# Patient Record
Sex: Female | Born: 1965 | Race: White | Hispanic: No | Smoking: Former smoker
Health system: Southern US, Community
[De-identification: ages and names within clinical notes are randomized; demographics above are authoritative.]

## PROBLEM LIST (undated history)

## (undated) DIAGNOSIS — F419 Anxiety disorder, unspecified: Secondary | ICD-10-CM

## (undated) DIAGNOSIS — I341 Nonrheumatic mitral (valve) prolapse: Secondary | ICD-10-CM

## (undated) DIAGNOSIS — F32A Depression, unspecified: Secondary | ICD-10-CM

## (undated) DIAGNOSIS — E785 Hyperlipidemia, unspecified: Secondary | ICD-10-CM

## (undated) DIAGNOSIS — J9383 Other pneumothorax: Secondary | ICD-10-CM

## (undated) DIAGNOSIS — J8489 Other specified interstitial pulmonary diseases: Secondary | ICD-10-CM

## (undated) DIAGNOSIS — J8410 Pulmonary fibrosis, unspecified: Secondary | ICD-10-CM

## (undated) DIAGNOSIS — K589 Irritable bowel syndrome without diarrhea: Secondary | ICD-10-CM

## (undated) DIAGNOSIS — G43909 Migraine, unspecified, not intractable, without status migrainosus: Secondary | ICD-10-CM

## (undated) DIAGNOSIS — D126 Benign neoplasm of colon, unspecified: Secondary | ICD-10-CM

## (undated) DIAGNOSIS — K635 Polyp of colon: Secondary | ICD-10-CM

## (undated) DIAGNOSIS — K7460 Unspecified cirrhosis of liver: Secondary | ICD-10-CM

## (undated) DIAGNOSIS — K219 Gastro-esophageal reflux disease without esophagitis: Secondary | ICD-10-CM

## (undated) DIAGNOSIS — F329 Major depressive disorder, single episode, unspecified: Secondary | ICD-10-CM

## (undated) DIAGNOSIS — D509 Iron deficiency anemia, unspecified: Secondary | ICD-10-CM

## (undated) HISTORY — DX: Migraine, unspecified, not intractable, without status migrainosus: G43.909

## (undated) HISTORY — DX: Unspecified cirrhosis of liver: K74.60

## (undated) HISTORY — DX: Polyp of colon: K63.5

## (undated) HISTORY — DX: Other pneumothorax: J93.83

## (undated) HISTORY — DX: Irritable bowel syndrome, unspecified: K58.9

## (undated) HISTORY — DX: Benign neoplasm of colon, unspecified: D12.6

## (undated) HISTORY — DX: Major depressive disorder, single episode, unspecified: F32.9

## (undated) HISTORY — DX: Iron deficiency anemia, unspecified: D50.9

## (undated) HISTORY — DX: Gastro-esophageal reflux disease without esophagitis: K21.9

## (undated) HISTORY — DX: Anxiety disorder, unspecified: F41.9

## (undated) HISTORY — DX: Nonrheumatic mitral (valve) prolapse: I34.1

## (undated) HISTORY — PX: NASAL SEPTUM SURGERY: SHX37

## (undated) HISTORY — DX: Depression, unspecified: F32.A

## (undated) HISTORY — DX: Hyperlipidemia, unspecified: E78.5

## (undated) HISTORY — PX: FINGER FRACTURE SURGERY: SHX638

---

## 1999-05-02 ENCOUNTER — Other Ambulatory Visit: Admission: RE | Admit: 1999-05-02 | Discharge: 1999-05-02 | Payer: Self-pay | Admitting: Obstetrics & Gynecology

## 1999-07-19 ENCOUNTER — Encounter: Payer: Self-pay | Admitting: Internal Medicine

## 1999-07-19 ENCOUNTER — Ambulatory Visit (HOSPITAL_COMMUNITY): Admission: RE | Admit: 1999-07-19 | Discharge: 1999-07-19 | Payer: Self-pay | Admitting: Internal Medicine

## 1999-07-29 ENCOUNTER — Ambulatory Visit (HOSPITAL_COMMUNITY): Admission: RE | Admit: 1999-07-29 | Discharge: 1999-07-29 | Payer: Self-pay | Admitting: Internal Medicine

## 2000-05-28 ENCOUNTER — Other Ambulatory Visit: Admission: RE | Admit: 2000-05-28 | Discharge: 2000-05-28 | Payer: Self-pay | Admitting: Obstetrics & Gynecology

## 2001-06-10 ENCOUNTER — Other Ambulatory Visit: Admission: RE | Admit: 2001-06-10 | Discharge: 2001-06-10 | Payer: Self-pay | Admitting: Obstetrics & Gynecology

## 2001-11-06 HISTORY — PX: OTHER SURGICAL HISTORY: SHX169

## 2002-07-14 ENCOUNTER — Other Ambulatory Visit: Admission: RE | Admit: 2002-07-14 | Discharge: 2002-07-14 | Payer: Self-pay | Admitting: Obstetrics & Gynecology

## 2003-02-27 ENCOUNTER — Encounter: Admission: RE | Admit: 2003-02-27 | Discharge: 2003-02-27 | Payer: Self-pay | Admitting: Family Medicine

## 2003-02-27 ENCOUNTER — Encounter: Payer: Self-pay | Admitting: Family Medicine

## 2003-11-07 HISTORY — PX: TONSILLECTOMY: SUR1361

## 2003-12-06 ENCOUNTER — Emergency Department (HOSPITAL_COMMUNITY): Admission: EM | Admit: 2003-12-06 | Discharge: 2003-12-07 | Payer: Self-pay | Admitting: Emergency Medicine

## 2004-01-04 ENCOUNTER — Other Ambulatory Visit: Admission: RE | Admit: 2004-01-04 | Discharge: 2004-01-04 | Payer: Self-pay | Admitting: Obstetrics & Gynecology

## 2004-04-15 ENCOUNTER — Encounter (INDEPENDENT_AMBULATORY_CARE_PROVIDER_SITE_OTHER): Payer: Self-pay | Admitting: *Deleted

## 2004-04-15 ENCOUNTER — Ambulatory Visit (HOSPITAL_BASED_OUTPATIENT_CLINIC_OR_DEPARTMENT_OTHER): Admission: RE | Admit: 2004-04-15 | Discharge: 2004-04-15 | Payer: Self-pay | Admitting: Otolaryngology

## 2004-04-15 ENCOUNTER — Ambulatory Visit (HOSPITAL_COMMUNITY): Admission: RE | Admit: 2004-04-15 | Discharge: 2004-04-15 | Payer: Self-pay | Admitting: Otolaryngology

## 2005-01-13 ENCOUNTER — Other Ambulatory Visit: Admission: RE | Admit: 2005-01-13 | Discharge: 2005-01-13 | Payer: Self-pay | Admitting: Obstetrics & Gynecology

## 2005-09-18 ENCOUNTER — Ambulatory Visit: Payer: Self-pay | Admitting: Internal Medicine

## 2005-10-16 ENCOUNTER — Ambulatory Visit: Payer: Self-pay | Admitting: Internal Medicine

## 2005-12-22 ENCOUNTER — Ambulatory Visit: Payer: Self-pay | Admitting: Internal Medicine

## 2006-04-04 ENCOUNTER — Encounter (INDEPENDENT_AMBULATORY_CARE_PROVIDER_SITE_OTHER): Payer: Self-pay | Admitting: *Deleted

## 2006-04-04 ENCOUNTER — Ambulatory Visit: Payer: Self-pay | Admitting: Gynecology

## 2006-04-04 ENCOUNTER — Ambulatory Visit (HOSPITAL_COMMUNITY): Admission: RE | Admit: 2006-04-04 | Discharge: 2006-04-04 | Payer: Self-pay | Admitting: Family Medicine

## 2006-04-30 ENCOUNTER — Ambulatory Visit: Payer: Self-pay | Admitting: Internal Medicine

## 2006-06-12 ENCOUNTER — Ambulatory Visit: Payer: Self-pay | Admitting: Internal Medicine

## 2006-06-29 ENCOUNTER — Ambulatory Visit: Payer: Self-pay | Admitting: Internal Medicine

## 2006-11-06 HISTORY — PX: NISSEN FUNDOPLICATION: SHX2091

## 2007-10-11 ENCOUNTER — Ambulatory Visit: Payer: Self-pay | Admitting: Internal Medicine

## 2007-12-09 ENCOUNTER — Encounter: Payer: Self-pay | Admitting: Internal Medicine

## 2007-12-09 ENCOUNTER — Ambulatory Visit (HOSPITAL_COMMUNITY): Admission: RE | Admit: 2007-12-09 | Discharge: 2007-12-09 | Payer: Self-pay | Admitting: Internal Medicine

## 2007-12-16 ENCOUNTER — Ambulatory Visit: Payer: Self-pay | Admitting: Internal Medicine

## 2008-01-09 ENCOUNTER — Encounter: Admission: RE | Admit: 2008-01-09 | Discharge: 2008-01-09 | Payer: Self-pay | Admitting: Surgery

## 2008-02-01 ENCOUNTER — Inpatient Hospital Stay (HOSPITAL_COMMUNITY): Admission: AD | Admit: 2008-02-01 | Discharge: 2008-02-02 | Payer: Self-pay | Admitting: Surgery

## 2008-02-18 ENCOUNTER — Ambulatory Visit (HOSPITAL_COMMUNITY): Admission: RE | Admit: 2008-02-18 | Discharge: 2008-02-18 | Payer: Self-pay | Admitting: Surgery

## 2008-02-20 ENCOUNTER — Ambulatory Visit (HOSPITAL_COMMUNITY): Admission: RE | Admit: 2008-02-20 | Discharge: 2008-02-20 | Payer: Self-pay | Admitting: General Surgery

## 2008-03-03 ENCOUNTER — Encounter: Payer: Self-pay | Admitting: Internal Medicine

## 2008-08-04 ENCOUNTER — Encounter: Payer: Self-pay | Admitting: Internal Medicine

## 2009-11-15 ENCOUNTER — Ambulatory Visit: Payer: Self-pay | Admitting: Internal Medicine

## 2009-11-15 ENCOUNTER — Telehealth (INDEPENDENT_AMBULATORY_CARE_PROVIDER_SITE_OTHER): Payer: Self-pay | Admitting: *Deleted

## 2009-11-15 DIAGNOSIS — R030 Elevated blood-pressure reading, without diagnosis of hypertension: Secondary | ICD-10-CM | POA: Insufficient documentation

## 2009-12-18 ENCOUNTER — Emergency Department (HOSPITAL_COMMUNITY): Admission: EM | Admit: 2009-12-18 | Discharge: 2009-12-18 | Payer: Self-pay | Admitting: Family Medicine

## 2010-02-01 ENCOUNTER — Ambulatory Visit: Payer: Self-pay | Admitting: Internal Medicine

## 2010-02-01 LAB — CONVERTED CEMR LAB
Albumin: 3.8 g/dL (ref 3.5–5.2)
Alkaline Phosphatase: 48 units/L (ref 39–117)
BUN: 9 mg/dL (ref 6–23)
Basophils Absolute: 0.1 10*3/uL (ref 0.0–0.1)
Chloride: 103 meq/L (ref 96–112)
Cholesterol: 205 mg/dL — ABNORMAL HIGH (ref 0–200)
Eosinophils Relative: 1.8 % (ref 0.0–5.0)
GFR calc non Af Amer: 115.05 mL/min (ref >60)
HCT: 37.2 % (ref 36.0–46.0)
HDL: 63.1 mg/dL (ref >39.00)
Hemoglobin: 12.7 g/dL (ref 12.0–15.0)
Lymphocytes Relative: 37.8 % (ref 12.0–46.0)
Lymphs Abs: 2.3 10*3/uL (ref 0.7–4.0)
Monocytes Relative: 7.2 % (ref 3.0–12.0)
Neutro Abs: 3.2 10*3/uL (ref 1.4–7.7)
Potassium: 4 meq/L (ref 3.5–5.1)
RBC: 3.85 M/uL — ABNORMAL LOW (ref 3.87–5.11)
RDW: 11.5 % (ref 11.5–14.6)
Sodium: 139 meq/L (ref 135–145)
TSH: 2.37 microintl units/mL (ref 0.35–5.50)
Total CHOL/HDL Ratio: 3
Total Protein: 6.8 g/dL (ref 6.0–8.3)
Triglycerides: 107 mg/dL (ref 0.0–149.0)
VLDL: 21.4 mg/dL (ref 0.0–40.0)
WBC: 6.1 10*3/uL (ref 4.5–10.5)

## 2010-02-11 ENCOUNTER — Ambulatory Visit: Payer: Self-pay | Admitting: Internal Medicine

## 2010-02-11 DIAGNOSIS — R740 Nonspecific elevation of levels of transaminase and lactic acid dehydrogenase [LDH]: Secondary | ICD-10-CM

## 2010-02-11 DIAGNOSIS — E785 Hyperlipidemia, unspecified: Secondary | ICD-10-CM

## 2010-02-11 LAB — CONVERTED CEMR LAB
Cholesterol, target level: 200 mg/dL
HDL goal, serum: 40 mg/dL
LDL Goal: 160 mg/dL

## 2010-02-15 ENCOUNTER — Telehealth (INDEPENDENT_AMBULATORY_CARE_PROVIDER_SITE_OTHER): Payer: Self-pay | Admitting: *Deleted

## 2010-12-06 NOTE — Progress Notes (Signed)
Summary: cpx-labs  Phone Note Call from Patient   Caller: Patient Summary of Call: Patient has a cpx scheduled on 02/11/2010 and cpx labs scheduled on march 28,2011. Need lab orders please. Initial call taken by: Barb Merino,  November 15, 2009 10:24 AM  Follow-up for Phone Call        V70.0: lipids, hepatic , BMET, TSH , CBC & dif Follow-up by: Marga Melnick MD,  November 15, 2009 5:55 PM    Additional Follow-up for Phone Call Additional follow up Details #2::    Labs are scheduled.Marland KitchenMarland KitchenMarland KitchenBarb Merino  November 17, 2009 10:02 AM  Follow-up by: Barb Merino,  November 17, 2009 10:02 AM

## 2010-12-06 NOTE — Assessment & Plan Note (Signed)
Summary: CHIRO SAYS BP IS HIGH--139/80; YEST=145/89///SPH   Vital Signs:  Patient profile:   45 year old female Weight:      134 pounds Temp:     98.0 degrees F oral Pulse rate:   62 / minute Resp:     15 per minute BP sitting:   118 / 70  (left arm)  Vitals Entered By: Jeremy Johann CMA (November 15, 2009 9:33 AM) CC: elevated bp, Hypertension Management Comments REVIEWED MED LIST, PATIENT AGREED DOSE AND INSTRUCTION CORRECT    CC:  elevated bp and Hypertension Management.  History of Present Illness: At Chiropractor's office BP repeatedly elevated; range 138-140/ ?. No PMH of HTN. Her father had HTN.No PMH of pre eclampsia.  Hypertension History:      She complains of headache, peripheral edema, visual symptoms, and neurologic problems, but denies chest pain, dyspnea with exertion, orthopnea, PND, syncope, and side effects from treatment.  She notes no problems with any antihypertensive medication side effects.  Migraines hormonally related; controlled with Maxalt . Minor epistaxis with dry air. Minimal palpitations; but family illnesses are a stress. N& T in arms, LUE > RUE, better with Chiropractry.        Negative major cardiovascular risk factors include female age less than 33 years old and non-tobacco-user status.     Preventive Screening-Counseling & Management  Alcohol-Tobacco     Smoking Status: quit  Caffeine-Diet-Exercise     Does Patient Exercise: yes  Allergies (verified): No Known Drug Allergies  Past History:  Past Medical History: Migraine headaches  Past Surgical History: G 2 P 2; Nissan fundoplication with ventral hernia repair 2008; Tonsillectomy 2005; Oophorectomy 2003 for cyst , Dr Arlyce Dice  Family History: Father: HTN,COAD Mother: lung CA Siblings: bro oral squamous cell  Social History: Former Smoker: quit 2000 Alcohol use-yes: socially Regular exercise-yes Avoids  excess saltSmoking Status:  quit Does Patient Exercise:  yes  Review  of Systems Eyes:  Complains of blurring; denies double vision and vision loss-both eyes; Ophth exam due this month. CV:  Denies leg cramps with exertion, lightheadness, and near fainting.  Physical Exam  General:  well-nourished,in no acute distress; alert,appropriate and cooperative throughout examination Lungs:  Normal respiratory effort, chest expands symmetrically. Lungs are clear to auscultation, no crackles or wheezes. Heart:  Normal rate and regular rhythm. S1 and S2 normal without gallop, murmur, click, rub . Repeat BP 128/82 on R Abdomen:  Bowel sounds positive,abdomen soft and non-tender without masses, organomegaly or hernias noted. No AAA or bruits Pulses:  R and L carotid,radial,dorsalis pedis and posterior tibial pulses are full and equal bilaterally Extremities:  No clubbing, cyanosis, edema. Skin:  Intact without suspicious lesions or rashes Psych:  memory intact for recent and remote, normally interactive, good eye contact, and not anxious appearing.     Impression & Recommendations:  Problem # 1:  ELEVATED BLOOD PRESSURE WITHOUT DIAGNOSIS OF HYPERTENSION (ICD-796.2)  Complete Medication List: 1)  Loestrin 24 Fe 1-20 Mg-mcg Tabs (Norethin ace-eth estrad-fe) .... Take as directed 2)  Maxalt 10 Mg Tabs (Rizatriptan benzoate) .... Take as needed migraine 3)  Fluoxetine Hcl  .... Prm pms  Hypertension Assessment/Plan:      The patient's hypertensive risk group is category A: No risk factors and no target organ damage.  Today's blood pressure is 118/70.    Patient Instructions: 1)  Check your Blood Pressure regularly. If it is above:135/95 ON AVERAGE  you should make an appointment.

## 2010-12-06 NOTE — Progress Notes (Signed)
Summary: questions re corn syrup,  Phone Note Call from Patient Call back at Home Phone [phone]   Caller: Patient Summary of Call: pt called had cpx on fri discussed high fructose corn syrup, was on line and had questions ref  corn s yrup and high maltrose syrup, wants to know if Dr Alwyn Ren has any reference material .Kandice Hams  February 15, 2010 12:26 PM  Initial call taken by: Kandice Hams,  February 15, 2010 12:26 PM  Follow-up for Phone Call        Glycemic Index is discussed in [employer] text Follow-up by: Marga Melnick MD,  February 15, 2010 3:10 PM  Additional Follow-up for Phone Call Additional follow up Details #1::        left message on machine for pt to return call Shary Decamp  February 15, 2010 4:56 PM     Additional Follow-up for Phone Call Additional follow up Details #2::    PT AWARE............Marland KitchenFelecia Deloach CMA  February 16, 2010 8:40 AM

## 2010-12-06 NOTE — Assessment & Plan Note (Signed)
Summary: CPX///SPH   Vital Signs:  Patient profile:   45 year old female Height:      61.25 inches Weight:      137.8 pounds BMI:     25.92 Temp:     98.8 degrees F oral Pulse rate:   72 / minute Resp:     14 per minute BP sitting:   118 / 80  (left arm) Cuff size:   large  Vitals Entered By: Shonna Chock (February 11, 2010 9:43 AM)  CC: CPX and discuss labs (copy given), Compared B/P cuffs- patient's cuff B/P:116/76 p:70, General Medical Evaluation, Lipid Management Comments REVIEWED MED LIST, PATIENT AGREED DOSE AND INSTRUCTION CORRECT    CC:  CPX and discuss labs (copy given), Compared B/P cuffs- patient's cuff B/P:116/76 p:70, General Medical Evaluation, and Lipid Management.  History of Present Illness: Olivia [name] is here for a physical; she is aymptomatic.  Lipid Management History:      Positive NCEP/ATP III risk factors include family history for ischemic heart disease (females less than 44 years old).  Negative NCEP/ATP III risk factors include female age less than 76 years old, no history of early menopause without estrogen hormone replacement, non-diabetic, HDL cholesterol greater than 60, non-tobacco-user status, non-hypertensive, no ASHD (atherosclerotic heart disease), no prior stroke/TIA, no peripheral vascular disease, and no history of aortic aneurysm.     Allergies (verified): No Known Drug Allergies  Past History:  Past Medical History: Migraine headaches; Elevated BP w/o diagnosis of HTN Hyperlipidemia: LDL 139.1, HDL 63.10 Pre Menstrual Mood Disorder  Past Surgical History: G 2 P 2; Nissan fundoplication with ventral hernia repair 2008 , Dr Wenda Low; Tonsillectomy 2005; Oophorectomy 2003 for cyst , Dr  Ilda Mori  Family History: Father: HTN,COAD, smoker Mother: lung cancer, ? MI in late 95s Siblings: brother : oral squamous cell, smoker; MGF DM  Social History: Former Smoker: quit 2000 Alcohol use-yes: socially Regular exercise-yes: walking,  yoga Avoids  excess salt  Review of Systems  The patient denies anorexia, fever, vision loss, decreased hearing, hoarseness, chest pain, syncope, dyspnea on exertion, peripheral edema, prolonged cough, hemoptysis, abdominal pain, melena, hematochezia, severe indigestion/heartburn, hematuria, incontinence, suspicious skin lesions, depression, unusual weight change, abnormal bleeding, enlarged lymph nodes, and angioedema.         Weight up 17#. PMH of EIB ; albuterol pre exercise of benefit Neuro:  Complains of numbness and tingling; denies brief paralysis and weakness; N&T in UE from forearms  to fingers, positional as per Chiropracter, Dr Mayford Knife. Headaches controlled ;no prodrome or aura..  Physical Exam  General:  well-nourished; alert,appropriate and cooperative throughout examination Head:  Normocephalic and atraumatic without obvious abnormalities.  Eyes:  No corneal or conjunctival inflammation noted.Perrla. Funduscopic exam benign, without hemorrhages, exudates or papilledema.  Ears:  External ear exam shows no significant lesions or deformities.  Otoscopic examination reveals clear canals, tympanic membranes are intact bilaterally without bulging, retraction, inflammation or discharge. Hearing is grossly normal bilaterally. Nose:  External nasal examination shows no deformity or inflammation. Nasal mucosa are pink and moist without lesions or exudates. Mouth:  Oral mucosa and oropharynx without lesions or exudates.  Teeth in good repair. Neck:  No deformities, masses, or tenderness noted. Mild asymmetry of thyroid ,R> L w/o nodules Lungs:  Normal respiratory effort, chest expands symmetrically. Lungs are clear to auscultation, no crackles or wheezes. Heart:  normal rate, regular rhythm, no gallop, no rub, no JVD, no HJR, and grade 1 /6 systolic murmur.  Abdomen:  Bowel sounds positive,abdomen soft and non-tender without masses, organomegaly or hernias noted. Genitalia:  Dr  Arlyce Dice Msk:  No deformity or scoliosis noted of thoracic or lumbar spine.   Pulses:  R and L carotid,radial,dorsalis pedis and posterior tibial pulses are full and equal bilaterally Extremities:  No clubbing, cyanosis, edema, or deformity noted with normal full range of motion of all joints.   Neurologic:  alert & oriented X3, strength normal in all extremities, gait normal, and DTRs symmetrical and normal.   Skin:  Intact without suspicious lesions or rashes Cervical Nodes:  No lymphadenopathy noted Axillary Nodes:  No palpable lymphadenopathy Psych:  memory intact for recent and remote, normally interactive, and good eye contact.     Impression & Recommendations:  Problem # 1:  ROUTINE GENERAL MEDICAL EXAM@HEALTH  CARE FACL (ICD-V70.0)  Orders: EKG w/ Interpretation (93000)  Problem # 2:  NONSPEC ELEVATION OF LEVELS OF TRANSAMINASE/LDH (ICD-790.4)  Problem # 3:  HYPERLIPIDEMIA (ICD-272.4)  Problem # 4:  ELEVATED BLOOD PRESSURE WITHOUT DIAGNOSIS OF HYPERTENSION (ICD-796.2)  Complete Medication List: 1)  Loestrin 24 Fe 1-20 Mg-mcg Tabs (Norethin ace-eth estrad-fe) .... Take as directed 2)  Maxalt 10 Mg Tabs (Rizatriptan benzoate) .... Take as needed migraine 3)  Fluoxetine Hcl  .... Prm pms  Other Orders: Tdap => 49yrs IM (16109) Admin 1st Vaccine (60454)  Lipid Assessment/Plan:      Based on NCEP/ATP III, the patient's risk factor category is "0-1 risk factors".  The patient's lipid goals are as follows: Total cholesterol goal is 200; LDL cholesterol goal is 160; HDL cholesterol goal is 40; Triglyceride goal is 150.  Her LDL cholesterol goal has been met.    Patient Instructions: 1)  Consume < 40 grams of HFCS sugar/ day as discussed. 2)  Please schedule a follow-up appointment in 3 months. 3)  Take 650-1000mg  of Tylenol every 4-6 hours as needed for relief of pain or comfort of fever AVOID taking more than 4000mg   in a 24 hour period (can cause liver damage in higher  doses). 4)  Hepatic Panel prior to visit, ICD-9:790.4     Immunizations Administered:  Tetanus Vaccine:    Vaccine Type: Tdap    Site: left deltoid    Mfr: GlaxoSmithKline    Dose: 0.5 ml    Route: IM    Given by: Chrae Malloy    Exp. Date: 01/29/2012    Lot #: [id number]    VIS given: 09/24/07 version given February 11, 2010.

## 2011-03-21 NOTE — Op Note (Signed)
NAMESONDA, COPPENS                  ACCOUNT NO.:  [id number]   MEDICAL RECORD NO.:  [id number]          PATIENT TYPE:  INP   LOCATION:  1539                         FACILITY:  [facility]   PHYSICIAN:  Thornton Park. Daphine Deutscher, MD  DATE OF BIRTH:  1966/08/11   DATE OF PROCEDURE:  01/31/2008  DATE OF DISCHARGE:                               OPERATIVE REPORT   PREOPERATIVE DIAGNOSIS:  Gastroesophageal reflux with a small hiatal  hernia.   POSTOPERATIVE DIAGNOSIS:  Gastroesophageal reflux with a small hiatal  hernia.   SURGEON:  Luretha Murphy, MD.   ASSISTANT:  Karie Soda.   PROCEDURE:  Laparoscopic Nissen fundoplication with a 3-suture wrap  around a #56 lighted bougie and a 2-suture pledgeted posterior closure  of the hiatus.   DESCRIPTION OF PROCEDURE:  Janai Maudlin is a 45 year old white female  brought to OR 1 on Friday afternoon, January 31, 2008, given general  anesthesia.  The abdomen was prepped with Techni-Care and draped  sterilely.  I entered the abdomen through the left upper quadrant using  a 0-degree 10-mm scope and an Optiview technique without difficulty.  The abdomen was insufflated.  Standard trocar position was used, using  also a 5-mm Nathanson in the upper midline.  The dissection began along  the pars flaccida, where I incised the gastrohepatic membrane and went  down and incised the right crus and dissected free the esophagus, taking  down the esophageal attachments.  I was easily view behind the esophagus  and so I went ahead and actually viewed the opposite side and then went  over and took down the short gastrics with the Harmonic.  This  dissection freed the upper portion of the stomach.  This dissection was  done with the Harmonic and bleeding was controlled.  Then I put 2  pledgeted sutures using the Endo stitch and extracorporeal ties to  approximate the diaphragm and then easily passed the 56 lighted bougie.  With it in place, I then did the fundus wrap around  the distal esophagus  with 3 sutures, intracorporeal ties using Endo stitch, taking purchases  of esophagus with each bite and once tied down again doing this over a  56 bougie.  The bougie was removed without difficulty and pictures were  taken.  It looked good and the wrap itself was about 2 cm in length.  No bleeding was noted.  It was deflated.  Wounds were closed with 4-0  Vicryl and were injected with 0.5% Marcaine.  Benzoin and Steri-Strips  were used.  The patient was taken to the recovery room in satisfactory  condition.      Thornton Park Daphine Deutscher, MD  Electronically Signed     MBM/MEDQ  D:  01/31/2008  T:  02/01/2008  Job:  [id number]   cc:   Hedwig Morton. Juanda Chance, MD  [address]  [place]  [place] [address]

## 2011-03-21 NOTE — Assessment & Plan Note (Signed)
[facility]                         GASTROENTEROLOGY OFFICE NOTE   NAME:Junio, SAFIYAH CISNEY                         MRN:          [id number]  DATE:10/11/2007                            DOB:          November 16, 1965    Olivia [name] is a 45 year old white female who is here today to discuss Nissan  fundoplication. We saw her in 2003 for gastroesophageal reflux disease  and she had an upper endoscopy which showed reflux esophagitis. There  was no significant hiatal hernia. She has been on Nexium initially 40 mg  b.i.d., but mostly recently 40 mg daily with breakthrough symptoms. She  has hoarseness, nocturnal cough, and no dysphagia. She stopped smoking a  long time ago. She has been having migraine headaches, which she  attributes to Nexium. Her insurance no longer covers her Nexium and she  is spending up to $150 a month for her Nexium. She is interested in  proceeding with Nissan fundoplication. She called the surgeons in  [place] to have surgery done, but she was referred to [place] for  preoperative evaluation.   Her last colonoscopy was done by me in August 2007.   Other medical problems included iron deficiency anemia,  anxiety/depression, and colon polyps in 2003.   MEDICATIONS:  1. Nexium 40 mg daily.  2. Birth control pills.  3. Multiple vitamins.   She tried Prilosec OTC without much improvement of her reflux.   PHYSICAL EXAMINATION:  VITAL SIGNS:  Blood pressure 100/78, pulse 80,  weight 132 pounds and stable.  GENERAL:  Alert, oriented, and in no distress.  LUNGS:  Clear to auscultation.  CARDIAC:  Normal S1 and normal S2.  ABDOMEN:  Soft, relaxed, with minimal tenderness at subxyphoid area.  Bowel sounds were normal active. There was no distension.  RECTAL:  Not done.   IMPRESSION:  This is a 45 year old white female with ongoing  gastroesophageal reflux disease of at least 4 years duration. Currently  refractory to use of her Nexium. Additional  problems include poor  insurance coverage of her medications and breakthrough symptoms. She is  also showing some signs of laryngopharyngeal reflux.   PLAN:  1. 24 hour interesophageal pH probe. This will be done by a Brabo      probe and she will be endoscopically inserted. At the same time, we      will proceed with upper endoscopy to assess for structural      problems.  2. Esophageal manometry to follow Brabo probe.  3. Samples of Nexium given.  4. An appointment with Dr. Luretha Murphy for consideration of Nissan      fundoplication. This will depend on the results of the above      evaluation.     Hedwig Morton. Juanda Chance, MD  Electronically Signed    DMB/MedQ  DD: 10/11/2007  DT: 10/12/2007  Job #: [id number]   cc:   Thornton Park Daphine Deutscher, MD

## 2011-03-24 NOTE — Op Note (Signed)
Olivia [name], BOUSQUET                  ACCOUNT NO.:  [id number]   MEDICAL RECORD NO.:  [id number]           PATIENT TYPE:   LOCATION:                                 FACILITY:   PHYSICIAN:  Hedwig Morton. Juanda Chance, MD     DATE OF BIRTH:  29-Jan-1966   DATE OF PROCEDURE:  DATE OF DISCHARGE:                               OPERATIVE REPORT   PROCEDURE:  Esophageal manometry.   ENDOSCOPIST:  Dr. Hedwig Morton. Brodie.   INDICATIONS FOR PROCEDURE:  This 45 year old white female has  experienced severe gastroesophageal reflux and heartburn while on large  doses of Nexium, also bloating and distention.  Her 48-hour Bravo pH  monitoring was impressively positive in the first as well as in the  second 24 hours.  Manometry was done without Nexium.   DESCRIPTION OF PROCEDURE:  The patient was rather anxious during the  study and she had a lot of pain and difficulty controlling her  swallowing and continued to clear her throat.  The probe had to be  repositioned several times.   RESULTS:  Fractional excess protocol was carried out.  The lower  esophageal sphincter was located at 39 cm from the incisors, with the  distal end at 43 cm from the incisors.  The total length of the ileus  was 4 cm.  The lower esophageal sphincter pressure was 23.7 mmHg, which  represents a normal measurement.  The lower esophageal sphincter relaxed  with each swallow 78% of the time, which is normal.  Nine measurements  were obtained.  The average residual pressure was 5.1 mmHg.  This is  normal.   Peristaltic bursts were measured throughout the esophagus, the proximal,  mid and distal esophagus, using a five-channel protocol.  In the mid-  channel peristaltic measurement had a low average pressure of 10 mm, up  to 28 mm, normal being more than 30 mmHg.  It also had prolonged  duration of up to 28.4 seconds at times.  The peristalsis in the distal  and proximal esophagus had normal amplitude and induration.  The  contractions  themselves were a result of the 11 wet swallows, and showed  55% of the peristaltic contraction, that is 6/11 contractions were  propulsive, but 1/11 contractions was simultaneous and three of them  were non-transmitted contractions.  There was also one retrograde  contraction, which represents 9%.   The upper esophageal sphincter was measured through five swallows and  showed normal amplitude and duration of the waves.  The upper esophageal  sphincter relaxed to the baseline, when she swallowed.   IMPRESSION:  This is a mildly abnormal esophageal manometry report,  showing normal lower esophageal sphincter pressure with normal  relaxation, but somewhat sluggish mid-esophageal peristalsis with low  amplitude and somewhat prolonged contractions.  This abnormality does  not fit in any specific diagnostic category and could be related to  esophagitis secondary to reflux or due to technical difficulties during  the procedure.   RECOMMENDATIONS:  The patient has an appointment with Dr. Molli Hazard B.  Daphine Deutscher, to assess her as a  candidate for a Nissen fundoplication.  I  feel that the patient would be a good candidate, providing her Nissen  fundoplication is created around a rather larger dilator.      Hedwig Morton. Juanda Chance, MD  Electronically Signed     DMB/MEDQ  D:  12/18/2007  T:  12/19/2007  Job:  [id number]   cc:   Thornton Park Daphine Deutscher, MD

## 2011-03-24 NOTE — Op Note (Signed)
Olivia [name], Olivia [name]                  ACCOUNT NO.:  [id number]   MEDICAL RECORD NO.:  [id number]          PATIENT TYPE:  AMB   LOCATION:  SDC                           FACILITY:  [facility]   PHYSICIAN:  Ilda Mori, M.D.   DATE OF BIRTH:  December 27, 1965   DATE OF PROCEDURE:  04/04/2006  DATE OF DISCHARGE:                                 OPERATIVE REPORT   PREOPERATIVE DIAGNOSIS:  Left ovarian cyst.   POSTOP DIAGNOSIS:  Left ovarian cyst.   PROCEDURE:  Laparoscopic left salpingo-oophorectomy.   SURGEON:  Ilda Mori, M.D.   ASSISTANT:  Ginger Carne, MD.   ANESTHESIA:  General endotracheal.   ESTIMATED LOSS:  Minimal   FINDINGS:  The left ovary was enlarged approximately 4 cm.  There was no  inflammation around the ovarian capsule, or papules noticed.  The pelvis is  completely free of endometriosis, adhesions, or inflammation.  The uterus  appeared normal.  The right tube and ovary appeared normal.   INDICATIONS:  This is a 45 year old female who presented to the office with  complaints of bladder pressure and pelvic pain.  On pelvic exam a tender  pelvic mass was noted and followup ultrasound confirmed the presence of a 4  cm left ovarian mass that appeared consistent with an endometrioma.  The  findings were discussed with the patient; and based upon the pelvic pressure  and pain, and the complexity of the ultrasound picture; a decision was made  to proceed with surgical removal.   DESCRIPTION OF PROCEDURE:  The patient was taken to the operating room,  placed in the supine position; and general endotracheal anesthesia was  induced.  She was then placed in dorsal supine position; and the abdomen,  perineum, and vagina were prepped and draped in a sterile fashion.  The  bladder was catheterized and a Hulka tenaculum was placed in the  endocervical canal and fixed to the anterior lip of the cervix.  An  incision was made vertically in the base of the umbilicus; and this was  carried down to the fascia which was entered sharply and extended bluntly  and sharply in a transverse fashion.   A Hasson laparoscopic port was then placed into the peritoneal cavity.  The  scope was placed to ensure that there was an intraperitoneal insertion; and  a pneumoperitoneum was created.  Two accessory ports were then placed in the  left and right lower quadrants lateral to the inferior epigastric vessels.  The pelvis was viewed with the findings noted above.  The left ovary and  tube were grasped; and the infundibular pelvic ligament was isolated.  The  ureter was identified well inferior to the infundibular pelvic ligament.  The infundibulopelvic ligament was then cauterized and cut; and the  dissection around the tube and ovary was carried out to the uterine ovarian  anastomosis.  The adnexa was then freed and left in the cul-de-sac the  operative site was then inspected; and further cautery was performed so that  the incision was perfectly dry.   At this point, the laparoscope was  removed from the umbilical port and an  Endobag was placed.  A laparoscope was then placed in the right lower  quadrant; and this was a 5-mm scope.  The Endobag was then advanced; and the  specimen was placed in the Endobag and the Endobag and the Hassan cannula  were removed from the umbilical port.  The laparoscope was then  reintroduced; and, once again, the incisions were noted to be dry.  The gas  was allowed to escape a figure-of-eight suture was placed in the fascia  below the umbilical site.  The skin over the umbilical site was then closed  with DERMABOND adhesive.  The right and left lower quadrant incisions were  closed with DERMABOND adhesive.  The patient tolerated the procedure well  and left the operating room in good condition.      Ilda Mori, M.D.  Electronically Signed     RK/MEDQ  D:  04/04/2006  T:  04/04/2006  Job:  [id number]

## 2011-03-24 NOTE — Op Note (Signed)
Olivia [name], Olivia [name]                  ACCOUNT NO.:  [id number]   MEDICAL RECORD NO.:  [id number]          PATIENT TYPE:  AMB   LOCATION:  ENDO                         FACILITY:  [facility]   PHYSICIAN:  Hedwig Morton. Juanda Chance, MD     DATE OF BIRTH:  April 23, 1966   DATE OF PROCEDURE:  12/16/2007  DATE OF DISCHARGE:  12/09/2007                               OPERATIVE REPORT   PROCEDURE:  Bravo pH monitoring study.   INDICATIONS FOR PROCEDURE:  This 45 year old female has complained of  heartburn and intractable reflux, regurgitation of the food inspite of  antireflux regimen.  Bravo probe placed endoscopically for  intraesophageal pH monitoring.   In the first day analysis, total DeMeester's score was 87.4.  This is  very high total score with normal being less than 14.  In this period of  time the patient had 81 reflux episodes, 59 of them in supine position  and 60 of them in postprandially.  There were at least 15 long reflux  episodes which lasted each more than five minutes.  12 of them occurred  in postprandial.  The longest reflux episode lasted 53 minutes and  actually occurred in upright position.  Total time the patient's  intraesophageal pH was less than 4 was 295 minutes.  133 minutes in  upright position and 162 minutes in supine position and 236 minutes in  postprandial situation.  Total fraction of the time the patient's pH was  less than 4 was 26% of the time.   In the second 24-hour period, the total DeMeester's score was 34.9,  again above normal of 14.72.  In total analysis, the patient at least  80% of the time pH less than 4 with longest reflux episode lasting 53  minutes and total of 150 reflux episodes altogether.   IMPRESSION:  This is a very positive intraesophageal pH monitoring study  showing continued reflux during the supine as well as recumbent position  as well as postprandial period of time.  It corresponds with the  patient's intractable reflux.      Hedwig Morton.  Juanda Chance, MD  Electronically Signed     DMB/MEDQ  D:  12/16/2007  T:  12/17/2007  Job:  [id number]   cc:   [place] Daphine Deutscher, MD

## 2011-03-24 NOTE — Op Note (Signed)
[name], [name]                              ACCOUNT NO.:  [id number]   MEDICAL RECORD NO.:  [id number]                   PATIENT TYPE:  AMB   LOCATION:  DSC                                  FACILITY:  [facility]   PHYSICIAN:  Christopher E. Ezzard Standing, M.D.         DATE OF BIRTH:  July 21, 1966   DATE OF PROCEDURE:  04/15/2004  DATE OF DISCHARGE:                                 OPERATIVE REPORT   PREOPERATIVE DIAGNOSIS:  Chronic, cryptic tonsillitis.   POSTOPERATIVE DIAGNOSIS:  Chronic, cryptic tonsillitis.   OPERATION:  Tonsillectomy.   SURGEON:  Kristine Garbe. Ezzard Standing, M.D.   ANESTHESIA:  General endotracheal anesthesia.   COMPLICATIONS:  None.   BRIEF CLINICAL NOTE:  Olivia [name] is a 45 year old female who has had  frequent sore throats as well as a large amount of white debris building up  in her tonsil crypts.  She has been cleaning this out for a number of years  but it keeps recurring and she is taken to the operating room at this time  for tonsillectomy.   DESCRIPTION OF PROCEDURE:  After adequate endotracheal anesthesia, the  patient received 10 mg Decadron IV preoperatively as well as 1 gram Ancef IV  preoperatively.  The mouth gag was used to expose the oropharynx.  The left  and right tonsils were resected from their tonsillar fossa using the  cautery.  Care was taken to preserve the anterior and posterior tonsillar  pillars as well as the uvula.  Hemostasis was obtained with cautery.  The  oropharynx was then irrigated with saline.  This completed the procedure.  Olivia [name] was awakened from anesthesia and transferred to the recovery room  postop doing well.   DISPOSITION:  Olivia [name] is discharged home later this morning on Amoxicillin  suspension 500 mg b.i.d. for one week, Tylenol and Lortab Elixir, 1 to 1 1/2  tbs q.4h. p.r.n. pain.  She is to follow up in my office in 2-3 weeks for  recheck.                                               Kristine Garbe. Ezzard Standing, M.D.    CEN/MEDQ  D:  04/15/2004  T:  04/15/2004  Job:  [id number]

## 2011-05-05 ENCOUNTER — Other Ambulatory Visit: Payer: Self-pay | Admitting: Obstetrics & Gynecology

## 2011-07-17 ENCOUNTER — Encounter: Payer: Self-pay | Admitting: *Deleted

## 2011-07-17 ENCOUNTER — Telehealth: Payer: Self-pay | Admitting: Internal Medicine

## 2011-07-17 NOTE — Telephone Encounter (Signed)
Spoke with patient and she reports for the last couple of weeks, she has had heartburn and upper abdominal discomfort. She has been taking Prilosec x 14 days and still is having problems. Also is having coughing, bloating, raspy voice and sore at the top of the abdomen. Patient would like to be seen. Scheduled patient with Olivia Gip, PA  On 07/21/11 at  1:30 PM. Hx: GERD, s/p Nissen fundoplication 02/2008.

## 2011-07-21 ENCOUNTER — Encounter: Payer: Self-pay | Admitting: Physician Assistant

## 2011-07-21 ENCOUNTER — Ambulatory Visit (INDEPENDENT_AMBULATORY_CARE_PROVIDER_SITE_OTHER): Payer: 59 | Admitting: Physician Assistant

## 2011-07-21 ENCOUNTER — Encounter (INDEPENDENT_AMBULATORY_CARE_PROVIDER_SITE_OTHER): Payer: Self-pay | Admitting: General Surgery

## 2011-07-21 VITALS — BP 110/66 | HR 72 | Ht 61.0 in | Wt 138.0 lb

## 2011-07-21 DIAGNOSIS — Z8669 Personal history of other diseases of the nervous system and sense organs: Secondary | ICD-10-CM

## 2011-07-21 DIAGNOSIS — K219 Gastro-esophageal reflux disease without esophagitis: Secondary | ICD-10-CM

## 2011-07-21 MED ORDER — DEXLANSOPRAZOLE 60 MG PO CPDR: DELAYED_RELEASE_CAPSULE | ORAL | Status: DC

## 2011-07-21 NOTE — Progress Notes (Signed)
  Subjective:    Patient ID: Olivia [name], female    DOB: 12/14/1965, 45 y.o.   MRN: [id number]  HPI Olivia [name] is a pleasant 45 year old female known to Dr. Lina Sar who has history of chronic GERD. She underwent Nissen fundoplication in March of 2009 per Dr. Wenda Low. She says she did well postoperatively , and has had no problems with acid reflux until about a month ago. 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 Since that time she started having heartburn and reflux which has been especially bad at night. She started taking over-the-counter Prilosec once daily which helped a bit and has also been using antacids. She is concerned because she's having daily symptoms at this point despite Prilosec with a burning sensation in the back of her throat in her larynx as well as some hoarseness. She also mentions a sensitivity of her gums which she remembers from prior to her Nissen. She has no complaint of dysphagia or odynophagia however, relates a burning sensation in her chest all day long .She also has some mild discomfort in her epigastrium with fullness and pressure    Review of Systems  Constitutional: Negative.   HENT: Positive for sore throat and voice change.   Eyes: Negative.   Respiratory: Negative.   Cardiovascular: Negative.   Gastrointestinal: Positive for abdominal pain.  Genitourinary: Negative.   Musculoskeletal: Negative.   Skin: Negative.   Neurological: Negative.   Hematological: Negative.   Psychiatric/Behavioral: Negative.        Outpatient Prescriptions Prior to Visit  Medication Sig Dispense Refill  . rizatriptan (MAXALT) 10 MG tablet Take 10 mg by mouth as needed. May repeat in 2 hours if needed       . Norethindrone Acetate-Ethinyl Estrad-FE (LOESTRIN 24 FE) 1-20 MG-MCG(24) tablet Take  by mouth daily. Take as directed        Allergies  Allergen Reactions  . Hydrocodone   . Oxycodone   . Vicodin (Hydrocodone-Acetaminophen)     Objective:   Physical Exam Well-developed white female in no acute distress, pleasant, alert oriented x3 HEENT nontraumatic normocephalic EOMI PERRLA sclera anicteric Supple no JVD Cardiovascular regular rate and rhythm with S1-S2 no murmur or gallop Pulmonary clear bilaterally Abdomen soft she is minimally tender in the epigastrium there is no palpable mass or hepatosplenomegaly no guarding or rebound, bowel sounds are active Rectal not done Extremities no clubbing cyanosis or edema skin warm and dry Psych mood and affect normal an appropriate        Assessment & Plan:  #[age] year old female status post Nissen fundoplication March 2009, with complaint of severe heartburn and hoarseness x1 month. I suspect she has loosened her wrap.  Plan; Will obtain barium swallow and upper GI Start Dexilant 60 mg by mouth every morning; samples given Strict antireflux precautions She will likely need referral back to Dr. Daphine Deutscher, however she is reluctant to pursue a second surgical procedure and will see how she responds to PPI therapy first.  #2 colon neoplasia screening, patient with history of adenomatous colon polyp, last colonoscopy August 2007 no recurrent polyps and followup suggested at 7 year interval.

## 2011-07-21 NOTE — Progress Notes (Signed)
Agree with initial assessment and plans 

## 2011-07-21 NOTE — Patient Instructions (Signed)
We have scheduled a Barium Swallow test at [facility] for Mon 9-17. Directions provided.  We also have given you Dexilant samples to take 1 capsule 30 min before breakfast. We have given you Reflux and heartburn information.

## 2011-07-24 ENCOUNTER — Other Ambulatory Visit (HOSPITAL_COMMUNITY): Payer: 59

## 2011-07-26 ENCOUNTER — Other Ambulatory Visit: Payer: Self-pay | Admitting: Physician Assistant

## 2011-07-26 ENCOUNTER — Telehealth: Payer: Self-pay | Admitting: *Deleted

## 2011-07-26 ENCOUNTER — Ambulatory Visit (HOSPITAL_COMMUNITY)
Admission: RE | Admit: 2011-07-26 | Discharge: 2011-07-26 | Disposition: A | Discharge status: Home / Self Care | Payer: 59 | Source: Ambulatory Visit | Attending: Physician Assistant | Admitting: Physician Assistant

## 2011-07-26 DIAGNOSIS — R0789 Other chest pain: Secondary | ICD-10-CM | POA: Insufficient documentation

## 2011-07-26 DIAGNOSIS — K219 Gastro-esophageal reflux disease without esophagitis: Secondary | ICD-10-CM | POA: Insufficient documentation

## 2011-07-26 NOTE — Telephone Encounter (Signed)
Spoke with patient and gave her the results. She will call back to set up an appointment.

## 2011-07-26 NOTE — Telephone Encounter (Signed)
Scheduled patient on 08/30/11 at 8:45 AM OV with Dr. Juanda Chance.

## 2011-07-26 NOTE — Telephone Encounter (Signed)
Message copied by Daphine Deutscher on Wed Jul 26, 2011 12:00 PM ------      Message from: [place], [place] S      Created: Wed Jul 26, 2011 11:57 AM       Please let Denaja know the Ba swallow/ugi  Shows evidence of her wrap so it has not come undone. Cannot say about loosening. She needs a follow up with dr. Juanda Chance

## 2011-07-31 LAB — HEMOGLOBIN AND HEMATOCRIT, BLOOD: HCT: 33.1 — ABNORMAL LOW

## 2011-08-24 ENCOUNTER — Other Ambulatory Visit: Payer: Self-pay | Admitting: Internal Medicine

## 2011-08-24 ENCOUNTER — Encounter: Payer: Self-pay | Admitting: *Deleted

## 2011-08-24 NOTE — Telephone Encounter (Signed)
I have reviewed Amy's not and UGI series. OK to refill Dexilant, I don't need to see her if she is doing well.

## 2011-08-24 NOTE — Telephone Encounter (Signed)
I have spoken to patient. She states that she is doing better now and thinks some of her problems may be from weight gain. She would like to cancel appointment for now. She would also like to contact her pharmacy about Nexium vs Dexilant coverage and states she will call us back.

## 2011-08-24 NOTE — Telephone Encounter (Signed)
Dr Juanda Chance- Patient states that she has an upcoming office visit on 08/30/11 but wants to know if we will just send refills of dexilant instead. States that her barium swallow came back fine so she is unsure as to what coming for a return visit would accomplish. I have explained that although the test did come back negative, there is still question as to if the nissen has loosened. I have also explained that there may be further testing needed and that you may want to discuss other options with her. Patient wants me to double check to make sure that the office visit will be "worth time and money" and wants to know what will be discussed.

## 2011-08-30 ENCOUNTER — Ambulatory Visit: Payer: 59 | Admitting: Internal Medicine

## 2012-02-06 ENCOUNTER — Other Ambulatory Visit: Payer: Self-pay | Admitting: Physical Medicine and Rehabilitation

## 2012-02-06 DIAGNOSIS — M25551 Pain in right hip: Secondary | ICD-10-CM

## 2012-02-09 ENCOUNTER — Encounter (HOSPITAL_COMMUNITY): Payer: Self-pay | Admitting: *Deleted

## 2012-02-09 ENCOUNTER — Emergency Department (HOSPITAL_COMMUNITY)
Admission: EM | Admit: 2012-02-09 | Discharge: 2012-02-10 | Disposition: A | Discharge status: Home / Self Care | Payer: 59 | Attending: Emergency Medicine | Admitting: Emergency Medicine

## 2012-02-09 DIAGNOSIS — M1610 Unilateral primary osteoarthritis, unspecified hip: Secondary | ICD-10-CM | POA: Insufficient documentation

## 2012-02-09 DIAGNOSIS — K219 Gastro-esophageal reflux disease without esophagitis: Secondary | ICD-10-CM | POA: Insufficient documentation

## 2012-02-09 DIAGNOSIS — M169 Osteoarthritis of hip, unspecified: Secondary | ICD-10-CM

## 2012-02-09 DIAGNOSIS — F341 Dysthymic disorder: Secondary | ICD-10-CM | POA: Insufficient documentation

## 2012-02-09 DIAGNOSIS — K589 Irritable bowel syndrome without diarrhea: Secondary | ICD-10-CM | POA: Insufficient documentation

## 2012-02-09 DIAGNOSIS — J45909 Unspecified asthma, uncomplicated: Secondary | ICD-10-CM | POA: Insufficient documentation

## 2012-02-09 DIAGNOSIS — K7460 Unspecified cirrhosis of liver: Secondary | ICD-10-CM | POA: Insufficient documentation

## 2012-02-09 DIAGNOSIS — M25559 Pain in unspecified hip: Secondary | ICD-10-CM | POA: Insufficient documentation

## 2012-02-09 DIAGNOSIS — E785 Hyperlipidemia, unspecified: Secondary | ICD-10-CM | POA: Insufficient documentation

## 2012-02-09 DIAGNOSIS — Z79899 Other long term (current) drug therapy: Secondary | ICD-10-CM | POA: Insufficient documentation

## 2012-02-09 NOTE — ED Notes (Signed)
Pt reports (R) hip pain that is 'clicking'.  Reports it has been hurting for several weeks.  Pt reports groin pain that radiates down her leg.  Pt ambulatory with a limp.  No distress noted.

## 2012-02-10 ENCOUNTER — Emergency Department (HOSPITAL_COMMUNITY): Payer: 59

## 2012-02-10 MED ORDER — NAPROXEN SODIUM 220 MG PO TABS
440.0000 mg | ORAL_TABLET | Freq: Two times a day (BID) | ORAL | Status: DC
Start: 2012-02-10 — End: 2012-12-12

## 2012-02-10 MED ORDER — TRAMADOL HCL 50 MG PO TABS: 100.0000 mg | ORAL_TABLET | Freq: Four times a day (QID) | ORAL | Status: AC | PRN

## 2012-02-10 NOTE — ED Notes (Signed)
Rx x 2, pt voiced understanding to f/u with orthopedist

## 2012-02-10 NOTE — ED Provider Notes (Signed)
History     CSN: [id number]  Arrival date & time 02/09/12  2245   First MD Initiated Contact with Patient 02/10/12 0215      Chief Complaint  Patient presents with  . Hip Pain    (Consider location/radiation/quality/duration/timing/severity/associated sxs/prior treatment) HPI This is a 46 year old white female with several week history of pain in her right hip. The pain is specifically located in her right anterior groin fold and the right greater trochanter. The pain is exacerbated by movement and certain positions. There is an associated subjective clicking sensation. She has been taking tramadol but the pain became worse yesterday to the point of being severe. She denies injury.  Past Medical History  Diagnosis Date  . Migraine headache   . Hyperlipidemia   . Asthma   . Colon polyps   . GERD (gastroesophageal reflux disease)   . IBS (irritable bowel syndrome)   . MVP (mitral valve prolapse)   . Cirrhosis   . Adenomatous colon polyp   . Iron deficiency anemia   . Anxiety and depression   . Spontaneous pneumothorax     Past Surgical History  Procedure Date  . Nissen fundoplication 2008    with hiatal hernia repair: Dr. Wenda Low  . Tonsillectomy 2005  . Oophoretomy 2003    for cyst: Dr. Ilda Mori  . Nasal septum surgery     x 3  . Finger fracture surgery     Family History  Problem Relation Age of Onset  . Hypertension Father   . Lung cancer Mother   . Diabetes Maternal Grandfather   . Cancer Brother     oral squamous cell  . Colon polyps Mother   . Colitis Maternal Aunt   . Stroke Mother   . Diverticulitis Brother     also mother    History  Substance Use Topics  . Smoking status: Former Smoker    Quit date: 11/06/1998  . Smokeless tobacco: Never Used  . Alcohol Use: 0.0 oz/week     socially    OB History    Grav Para Term Preterm Abortions TAB SAB Ect Mult Living                  Review of Systems  All other systems reviewed and are  negative.    Allergies  Darvocet; Hydrocodone; Oxycodone; and Vicodin  Home Medications   Current Outpatient Rx  Name Route Sig Dispense Refill  . PROBIOTIC PO Oral Take 1 capsule by mouth once.      Marland Kitchen RIZATRIPTAN BENZOATE 10 MG PO TABS Oral Take 10 mg by mouth as needed. May repeat in 2 hours if needed       BP 132/91  Pulse 70  Temp(Src) 97.8 F (36.6 C) (Oral)  Resp 18  SpO2 96%  Physical Exam General: Well-developed, well-nourished female in no acute distress; appearance consistent with age of record HENT: normocephalic, atraumatic Eyes: pupils equal round and reactive to light; extraocular muscles intact Neck: supple Heart: regular rate and rhythm Lungs: Normal respiratory effort and excursion Abdomen: soft; nondistended; nontender Extremities: No deformity; decreased range of motion right hip due to pain; pulses normal; tenderness over right groin fold anteriorly and right greater trochanter without mass, deformity or swelling Neurologic: Awake, alert and oriented; motor function intact in all extremities and symmetric; no facial droop Skin: Warm and dry Psychiatric: Normal mood and affect    ED Course  Procedures (including critical care time)     MDM  Nursing notes and vitals signs, including pulse oximetry, reviewed.  Summary of this visit's results, reviewed by myself:  Imaging Studies: Dg Hip Complete Right  02/10/2012  *RADIOLOGY REPORT*  Clinical Data: Right groin pain  RIGHT HIP - COMPLETE 2+ VIEW  Comparison: None.  Findings: No fracture or dislocation is seen.  Mild degenerative changes of the right hip.  The left hip joint space is essentially preserved.  The visualized bony pelvis appears intact.  Visualized lower lumbar spine is within normal limits.  IMPRESSION: No fracture or dislocation is seen.  Mild degenerative changes of the right hip.  Original Report Authenticated By: Charline Bills, M.D.   Patient requests tramadol for pain. We will  treat with tramadol and naproxen sodium and refer to [facility].         Hanley Seamen, MD 02/10/12 [phone]

## 2012-02-10 NOTE — Discharge Instructions (Signed)
Hip Pain  The hips join the upper legs to the lower pelvis. The bones, cartilage, tendons, and muscles of the hip joint perform a lot of work each day holding your body weight and allowing you to move around.  Hip pain is a common symptom. It can range from a minor ache to severe pain on 1 or both hips. Pain may be felt on the inside of the hip joint near the groin, or the outside near the buttocks and upper thigh. There may be swelling or stiffness as well. It occurs more often when a person walks or performs activity. There are many reasons hip pain can develop.  CAUSES   It is important to work with your caregiver to identify the cause since many conditions can impact the bones, cartilage, muscles, and tendons of the hips. Causes for hip pain include:   Broken (fractured) bones.   Separation of the thighbone from the hip socket (dislocation).   Torn cartilage of the hip joint.   Swelling (inflammation) of a tendon (tendonitis), the sac within the hip joint (bursitis), or a joint.   A weakening in the abdominal wall (hernia), affecting the nerves to the hip.   Arthritis in the hip joint or lining of the hip joint.   Pinched nerves in the back, hip, or upper thigh.   A bulging disc in the spine (herniated disc).   Rarely, bone infection or cancer.  DIAGNOSIS   The location of your hip pain will help your caregiver understand what may be causing the pain. A diagnosis is based on your medical history, your symptoms, results from your physical exam, and results from diagnostic tests. Diagnostic tests may include X-ray exams, a computerized magnetic scan (magnetic resonance imaging, MRI), or bone scan.  TREATMENT   Treatment will depend on the cause of your hip pain. Treatment may include:   Limiting activities and resting until symptoms improve.   Crutches or other walking supports (a cane or brace).   Ice, elevation, and compression.   Physical therapy or home exercises.   Shoe inserts or special  shoes.   Losing weight.   Medications to reduce pain.   Undergoing surgery.  HOME CARE INSTRUCTIONS    Only take over-the-counter or prescription medicines for pain, discomfort, or fever as directed by your caregiver.   Put ice on the injured area:   Put ice in a plastic bag.   Place a towel between your skin and the bag.   Leave the ice on for 15 to 20 minutes at a time, 3 to 4 times a day.   Keep your leg raised (elevated) when possible to lessen swelling.   Avoid activities that cause pain.   Follow specific exercises as directed by your caregiver.   Sleep with a pillow between your legs on your most comfortable side.   Record how often you have hip pain, the location of the pain, and what it feels like. This information may be helpful to you and your caregiver.   Ask your caregiver about returning to work or sports and whether you should drive.   Follow up with your caregiver for further exams, therapy, or testing as directed.  SEEK MEDICAL CARE IF:    Your pain or swelling continues or worsens after 1 week.   You are feeling unwell or have chills.   You have increasing difficulty with walking.   You have a loss of sensation or other new symptoms.   You have questions   or concerns.  SEEK IMMEDIATE MEDICAL CARE IF:    You cannot put weight on the affected hip.   You have fallen.   You have a sudden increase in pain and swelling in your hip.   You have a fever.  MAKE SURE YOU:    Understand these instructions.   Will watch your condition.   Will get help right away if you are not doing well or get worse.  Document Released: 04/12/2010 Document Revised: 10/12/2011 Document Reviewed: 04/12/2010  [employer] Patient Information 2012 [employer].

## 2012-02-10 NOTE — ED Notes (Signed)
Pt c/o increasing pain in right hip, also states she has a "clicking sensation" when walking.  No n/v, CP, SOB.

## 2012-02-13 ENCOUNTER — Other Ambulatory Visit: Payer: 59

## 2012-04-15 ENCOUNTER — Other Ambulatory Visit: Payer: Self-pay | Admitting: Family Medicine

## 2012-04-15 ENCOUNTER — Ambulatory Visit
Admission: RE | Admit: 2012-04-15 | Discharge: 2012-04-15 | Disposition: A | Discharge status: Home / Self Care | Payer: 59 | Source: Ambulatory Visit | Attending: Family Medicine | Admitting: Family Medicine

## 2012-04-15 DIAGNOSIS — T1490XA Injury, unspecified, initial encounter: Secondary | ICD-10-CM

## 2012-12-12 ENCOUNTER — Emergency Department (HOSPITAL_COMMUNITY): Payer: 59

## 2012-12-12 ENCOUNTER — Emergency Department (HOSPITAL_COMMUNITY)
Admission: EM | Admit: 2012-12-12 | Discharge: 2012-12-12 | Disposition: A | Discharge status: Home / Self Care | Payer: 59 | Attending: Emergency Medicine | Admitting: Emergency Medicine

## 2012-12-12 ENCOUNTER — Encounter (HOSPITAL_COMMUNITY): Payer: Self-pay | Admitting: *Deleted

## 2012-12-12 DIAGNOSIS — Z860100 Personal history of colon polyps, unspecified: Secondary | ICD-10-CM | POA: Insufficient documentation

## 2012-12-12 DIAGNOSIS — G43909 Migraine, unspecified, not intractable, without status migrainosus: Secondary | ICD-10-CM | POA: Insufficient documentation

## 2012-12-12 DIAGNOSIS — F341 Dysthymic disorder: Secondary | ICD-10-CM | POA: Insufficient documentation

## 2012-12-12 DIAGNOSIS — Z9889 Other specified postprocedural states: Secondary | ICD-10-CM | POA: Insufficient documentation

## 2012-12-12 DIAGNOSIS — S0083XA Contusion of other part of head, initial encounter: Secondary | ICD-10-CM

## 2012-12-12 DIAGNOSIS — Z8719 Personal history of other diseases of the digestive system: Secondary | ICD-10-CM | POA: Insufficient documentation

## 2012-12-12 DIAGNOSIS — Y9389 Activity, other specified: Secondary | ICD-10-CM | POA: Insufficient documentation

## 2012-12-12 DIAGNOSIS — J45909 Unspecified asthma, uncomplicated: Secondary | ICD-10-CM | POA: Insufficient documentation

## 2012-12-12 DIAGNOSIS — Z79899 Other long term (current) drug therapy: Secondary | ICD-10-CM | POA: Insufficient documentation

## 2012-12-12 DIAGNOSIS — Z8639 Personal history of other endocrine, nutritional and metabolic disease: Secondary | ICD-10-CM | POA: Insufficient documentation

## 2012-12-12 DIAGNOSIS — Z87891 Personal history of nicotine dependence: Secondary | ICD-10-CM | POA: Insufficient documentation

## 2012-12-12 DIAGNOSIS — Z862 Personal history of diseases of the blood and blood-forming organs and certain disorders involving the immune mechanism: Secondary | ICD-10-CM | POA: Insufficient documentation

## 2012-12-12 DIAGNOSIS — Y929 Unspecified place or not applicable: Secondary | ICD-10-CM | POA: Insufficient documentation

## 2012-12-12 DIAGNOSIS — S0003XA Contusion of scalp, initial encounter: Secondary | ICD-10-CM | POA: Insufficient documentation

## 2012-12-12 DIAGNOSIS — W64XXXA Exposure to other animate mechanical forces, initial encounter: Secondary | ICD-10-CM | POA: Insufficient documentation

## 2012-12-12 DIAGNOSIS — Z8679 Personal history of other diseases of the circulatory system: Secondary | ICD-10-CM | POA: Insufficient documentation

## 2012-12-12 DIAGNOSIS — Z8709 Personal history of other diseases of the respiratory system: Secondary | ICD-10-CM | POA: Insufficient documentation

## 2012-12-12 MED ORDER — IBUPROFEN 800 MG PO TABS: 800.0000 mg | ORAL_TABLET | Freq: Three times a day (TID) | ORAL | Status: DC | PRN

## 2012-12-12 NOTE — ED Provider Notes (Signed)
History     CSN: [id number]  Arrival date & time 12/12/12  1450   First MD Initiated Contact with Patient 12/12/12 1705      No chief complaint on file.   (Consider location/radiation/quality/duration/timing/severity/associated sxs/prior treatment) HPI 47 year old female who presents today with a chief complaint of a nose injury.  The patient was playing with her 27 lb dog when the dog charged into her nose.  She heard 'crunching' when it happened of her nose but states that she doesn't believe she broke her nose.  She has had three deviated septum surgeries with the last surgery being 14 years ago.  Her nose and around her left eye is very tender and painful.  She believe that there is residual pain radiating to her top teeth.  Denies any bloody discharge from her nose and vision changes.   Past Medical History  Diagnosis Date  . Migraine headache   . Hyperlipidemia   . Asthma   . Colon polyps   . GERD (gastroesophageal reflux disease)   . IBS (irritable bowel syndrome)   . MVP (mitral valve prolapse)   . Cirrhosis   . Adenomatous colon polyp   . Iron deficiency anemia   . Anxiety and depression   . Spontaneous pneumothorax     Past Surgical History  Procedure Date  . Nissen fundoplication 2008    with hiatal hernia repair: Dr. Wenda Low  . Tonsillectomy 2005  . Oophoretomy 2003    for cyst: Dr. Ilda Mori  . Nasal septum surgery     x 3  . Finger fracture surgery     Family History  Problem Relation Age of Onset  . Hypertension Father   . Lung cancer Mother   . Diabetes Maternal Grandfather   . Cancer Brother     oral squamous cell  . Colon polyps Mother   . Colitis Maternal Aunt   . Stroke Mother   . Diverticulitis Brother     also mother    History  Substance Use Topics  . Smoking status: Former Smoker    Quit date: 11/06/1998  . Smokeless tobacco: Never Used  . Alcohol Use: 0.0 oz/week     Comment: socially    OB History    Grav Para Term  Preterm Abortions TAB SAB Ect Mult Living                  Review of Systems All other systems negative except as documented in the HPI. All pertinent positives and negatives as reviewed in the HPI.   Allergies  Darvocet; Hydrocodone; Oxycodone; and Vicodin  Home Medications   Current Outpatient Rx  Name  Route  Sig  Dispense  Refill  . ESTRADIOL 1 MG PO TABS   Oral   Take 1 mg by mouth daily.         Marland Kitchen FEXOFENADINE HCL 180 MG PO TABS   Oral   Take 180 mg by mouth daily.         Marland Kitchen FLUOXETINE HCL 20 MG PO CAPS   Oral   Take 20 mg by mouth daily.         Marland Kitchen MEDROXYPROGESTERONE ACETATE 2.5 MG PO TABS   Oral   Take 2.5 mg by mouth daily.         Marland Kitchen PROBIOTIC PO   Oral   Take 1 capsule by mouth daily.          Marland Kitchen RIZATRIPTAN BENZOATE 10 MG  PO TABS   Oral   Take 10 mg by mouth every 2 (two) hours as needed. May repeat in 2 hours if needed for migraines.           BP 127/94  Pulse 73  Temp 97.9 F (36.6 C) (Oral)  Resp 16  SpO2 100%  Physical Exam  Nursing note and vitals reviewed. Constitutional: She is oriented to person, place, and time. She appears well-developed and well-nourished.  HENT:  Head: Normocephalic.    Nose: No nasal deformity. No epistaxis. Right sinus exhibits no maxillary sinus tenderness. Left sinus exhibits maxillary sinus tenderness.    Mouth/Throat: Uvula is midline.  Eyes: Conjunctivae normal and EOM are normal. Pupils are equal, round, and reactive to light.  Neck: Normal range of motion. Neck supple.  Cardiovascular: Normal rate and regular rhythm.   Pulmonary/Chest: Effort normal and breath sounds normal.  Neurological: She is alert and oriented to person, place, and time. No cranial nerve deficit.  Psychiatric: She has a normal mood and affect.     ED Course  Procedures (including critical care time)  The patient is referred to her PCP for further care as needed. The patient was sent here for CT scan by her  PCP.   MDM          Carlyle Dolly, PA-C 12/15/12 [phone]

## 2012-12-12 NOTE — ED Notes (Addendum)
Pt reports 3 past nose surgeries. pts puppy head butted pt today, pt reports left sided nasal pain. Tearful from pain 10/10. No bleeding noted. No LOC but 'everything did get starry" for a minute.

## 2012-12-16 NOTE — ED Provider Notes (Signed)
Medical screening examination/treatment/procedure(s) were performed by non-physician practitioner and as supervising physician I was immediately available for consultation/collaboration. Dilon Lank, MD, FACEP   Artha Stavros L Emmanuelle Hibbitts, MD 12/16/12 1508 

## 2013-04-28 ENCOUNTER — Telehealth: Payer: Self-pay | Admitting: Internal Medicine

## 2013-04-28 NOTE — Telephone Encounter (Signed)
OK with me.

## 2013-04-28 NOTE — Telephone Encounter (Signed)
Spoke with patient and she would like to switch to Dr. Leone Payor. Dr. Juanda Chance is this okay?

## 2013-04-29 ENCOUNTER — Encounter: Payer: Self-pay | Admitting: *Deleted

## 2013-04-29 NOTE — Telephone Encounter (Signed)
Patient wants to switch to Dr. Leone Payor from Dr. Juanda Chance. Dr. Juanda Chance is ok with this. Is this ok with you, Dr. Leone Payor?

## 2013-04-29 NOTE — Telephone Encounter (Signed)
Scheduled patient on 06/20/13 at 1:45 PM per patient request.

## 2013-04-29 NOTE — Telephone Encounter (Signed)
Ok to schedule for a visit with me and we can discuss and possibly band hemorrhoids first time

## 2013-05-14 ENCOUNTER — Other Ambulatory Visit: Payer: Self-pay | Admitting: Rheumatology

## 2013-05-14 DIAGNOSIS — R05 Cough: Secondary | ICD-10-CM

## 2013-05-18 ENCOUNTER — Emergency Department (HOSPITAL_COMMUNITY): Payer: 59

## 2013-05-18 ENCOUNTER — Emergency Department (HOSPITAL_COMMUNITY)
Admission: EM | Admit: 2013-05-18 | Discharge: 2013-05-18 | Disposition: A | Discharge status: Home / Self Care | Payer: 59 | Attending: Emergency Medicine | Admitting: Emergency Medicine

## 2013-05-18 ENCOUNTER — Encounter (HOSPITAL_COMMUNITY): Payer: Self-pay | Admitting: Emergency Medicine

## 2013-05-18 DIAGNOSIS — G43909 Migraine, unspecified, not intractable, without status migrainosus: Secondary | ICD-10-CM | POA: Insufficient documentation

## 2013-05-18 DIAGNOSIS — Z87891 Personal history of nicotine dependence: Secondary | ICD-10-CM | POA: Insufficient documentation

## 2013-05-18 DIAGNOSIS — F341 Dysthymic disorder: Secondary | ICD-10-CM | POA: Insufficient documentation

## 2013-05-18 DIAGNOSIS — Z8709 Personal history of other diseases of the respiratory system: Secondary | ICD-10-CM | POA: Insufficient documentation

## 2013-05-18 DIAGNOSIS — Z8719 Personal history of other diseases of the digestive system: Secondary | ICD-10-CM | POA: Insufficient documentation

## 2013-05-18 DIAGNOSIS — Z860100 Personal history of colon polyps, unspecified: Secondary | ICD-10-CM | POA: Insufficient documentation

## 2013-05-18 DIAGNOSIS — Z79899 Other long term (current) drug therapy: Secondary | ICD-10-CM | POA: Insufficient documentation

## 2013-05-18 DIAGNOSIS — J45901 Unspecified asthma with (acute) exacerbation: Secondary | ICD-10-CM | POA: Insufficient documentation

## 2013-05-18 DIAGNOSIS — R059 Cough, unspecified: Secondary | ICD-10-CM | POA: Insufficient documentation

## 2013-05-18 DIAGNOSIS — Z8639 Personal history of other endocrine, nutritional and metabolic disease: Secondary | ICD-10-CM | POA: Insufficient documentation

## 2013-05-18 DIAGNOSIS — R0600 Dyspnea, unspecified: Secondary | ICD-10-CM

## 2013-05-18 DIAGNOSIS — Z862 Personal history of diseases of the blood and blood-forming organs and certain disorders involving the immune mechanism: Secondary | ICD-10-CM | POA: Insufficient documentation

## 2013-05-18 DIAGNOSIS — R079 Chest pain, unspecified: Secondary | ICD-10-CM | POA: Insufficient documentation

## 2013-05-18 DIAGNOSIS — R0989 Other specified symptoms and signs involving the circulatory and respiratory systems: Secondary | ICD-10-CM | POA: Insufficient documentation

## 2013-05-18 DIAGNOSIS — Z8679 Personal history of other diseases of the circulatory system: Secondary | ICD-10-CM | POA: Insufficient documentation

## 2013-05-18 DIAGNOSIS — R0609 Other forms of dyspnea: Secondary | ICD-10-CM | POA: Insufficient documentation

## 2013-05-18 DIAGNOSIS — R05 Cough: Secondary | ICD-10-CM | POA: Insufficient documentation

## 2013-05-18 LAB — CBC WITH DIFFERENTIAL/PLATELET
Eosinophils Relative: 2 % (ref 0–5)
HCT: 36 % (ref 36.0–46.0)
Hemoglobin: 12.4 g/dL (ref 12.0–15.0)
Lymphocytes Relative: 37 % (ref 12–46)
Lymphs Abs: 2.8 10*3/uL (ref 0.7–4.0)
MCH: 31.2 pg (ref 26.0–34.0)
MCV: 90.7 fL (ref 78.0–100.0)
Monocytes Relative: 6 % (ref 3–12)
Platelets: 250 10*3/uL (ref 150–400)
RBC: 3.97 MIL/uL (ref 3.87–5.11)
WBC: 7.4 10*3/uL (ref 4.0–10.5)

## 2013-05-18 LAB — BASIC METABOLIC PANEL
BUN: 11 mg/dL (ref 6–23)
CO2: 23 mEq/L (ref 19–32)
Calcium: 9.5 mg/dL (ref 8.4–10.5)
Glucose, Bld: 101 mg/dL — ABNORMAL HIGH (ref 70–99)
Sodium: 137 mEq/L (ref 135–145)

## 2013-05-18 LAB — URINALYSIS, ROUTINE W REFLEX MICROSCOPIC
Bilirubin Urine: NEGATIVE
Glucose, UA: NEGATIVE mg/dL
Hgb urine dipstick: NEGATIVE
Specific Gravity, Urine: 1.022 (ref 1.005–1.030)

## 2013-05-18 LAB — D-DIMER, QUANTITATIVE: D-Dimer, Quant: 0.3 ug/mL-FEU (ref 0.00–0.48)

## 2013-05-18 LAB — TROPONIN I: Troponin I: <0.3 ng/mL (ref <0.30)

## 2013-05-18 NOTE — ED Provider Notes (Signed)
History    CSN: [id number] Arrival date & time 05/18/13  7829  First MD Initiated Contact with Patient 05/18/13 [phone]     Chief Complaint  Patient presents with  . Shortness of Breath  . Chest Pain   (Consider location/radiation/quality/duration/timing/severity/associated sxs/prior Treatment) HPI Comments: Olivia [name] is a 47 y.o. female who is here for evaluation of shortness of breath that has been present for several months. In the last 3, days she's had constant anterior left chest pressure that radiates to the left upper back. She has a cough that  is nonproductive. She denies fever, chills, nausea, vomiting. She has generalized weakness. She feels "tired all the time". She saw her doctor about a month ago, had a chest x-ray done, and was told that it was abnormal. Based on that her doctor ordered a CT scan to be done on 05/14/13. The patient did not get that test done because she "could not afford it." There are no other known modifying factors.  Patient is a 47 y.o. female presenting with shortness of breath and chest pain. The history is provided by the patient.  Shortness of Breath Associated symptoms: chest pain   Chest Pain Associated symptoms: shortness of breath    Past Medical History  Diagnosis Date  . Migraine headache   . Hyperlipidemia   . Asthma   . Colon polyps   . GERD (gastroesophageal reflux disease)   . IBS (irritable bowel syndrome)   . MVP (mitral valve prolapse)   . Cirrhosis   . Adenomatous colon polyp   . Iron deficiency anemia   . Anxiety and depression   . Spontaneous pneumothorax    Past Surgical History  Procedure Laterality Date  . Nissen fundoplication  2008    with hiatal hernia repair: Dr. Wenda Low  . Tonsillectomy  2005  . Oophoretomy  2003    for cyst: Dr. Ilda Mori  . Nasal septum surgery      x 3  . Finger fracture surgery     Family History  Problem Relation Age of Onset  . Hypertension Father   . Lung cancer Mother    . Diabetes Maternal Grandfather   . Cancer Brother     oral squamous cell  . Colon polyps Mother   . Colitis Maternal Aunt   . Stroke Mother   . Diverticulitis Brother     also mother   History  Substance Use Topics  . Smoking status: Former Smoker    Quit date: 11/06/1998  . Smokeless tobacco: Never Used  . Alcohol Use: 0.0 oz/week     Comment: socially   OB History   Grav Para Term Preterm Abortions TAB SAB Ect Mult Living                 Review of Systems  Respiratory: Positive for shortness of breath.   Cardiovascular: Positive for chest pain.  All other systems reviewed and are negative.    Allergies  Darvocet; Hydrocodone; Oxycodone; and Vicodin  Home Medications   Current Outpatient Rx  Name  Route  Sig  Dispense  Refill  . celecoxib (CELEBREX) 400 MG capsule   Oral   Take 400 mg by mouth daily.         . cetirizine (ZYRTEC) 10 MG tablet   Oral   Take 10 mg by mouth daily.         Marland Kitchen estradiol (ESTRACE) 1 MG tablet   Oral   Take 1  mg by mouth daily.         Marland Kitchen FLUoxetine (PROZAC) 20 MG capsule   Oral   Take 20 mg by mouth daily.         . medroxyPROGESTERone (PROVERA) 2.5 MG tablet   Oral   Take 2.5 mg by mouth daily.         . rizatriptan (MAXALT) 10 MG tablet   Oral   Take 10 mg by mouth every 2 (two) hours as needed. May repeat in 2 hours if needed for migraines.          BP 106/65  Pulse 65  Temp(Src) 98.1 F (36.7 C) (Oral)  Resp 18  SpO2 98% Physical Exam  Nursing note and vitals reviewed. Constitutional: She is oriented to person, place, and time. She appears well-developed and well-nourished. No distress.  HENT:  Head: Normocephalic and atraumatic.  Eyes: Conjunctivae and EOM are normal. Pupils are equal, round, and reactive to light.  Neck: Normal range of motion and phonation normal. Neck supple.  Cardiovascular: Normal rate, regular rhythm and intact distal pulses.   Pulmonary/Chest: Effort normal and breath  sounds normal. No respiratory distress. She has no wheezes. She has no rales.  Abdominal: Soft. She exhibits no distension. There is no tenderness. There is no guarding.  Musculoskeletal: Normal range of motion.  Neurological: She is alert and oriented to person, place, and time. She has normal strength. She exhibits normal muscle tone.  Skin: Skin is warm and dry.  Psychiatric: She has a normal mood and affect. Her behavior is normal. Judgment and thought content normal.    ED Course  Procedures (including critical care time)   Patient Vitals for the past 24 hrs:  BP Temp Temp src Pulse Resp SpO2  05/18/13 1200 106/65 mmHg 98.1 F (36.7 C) Oral 65 18 98 %  05/18/13 0853 - - - - - 98 %  05/18/13 0850 97/67 mmHg 98.1 F (36.7 C) Oral 65 16 96 %    12:36 PM Reevaluation with update and discussion. After initial assessment and treatment, an updated evaluation reveals no further complaints, she still wants a CT scan to "find, out what's wrong". She would like to talk to a pulmonologist, prior to her leaving. [name]   12:37 PM-Consult complete with Pulmonary oncall; Dr. Shelle Iron . Patient case explained and discussed. He agrees to see patient in the officefor further evaluation and treatment. He also states that she can see her PCP, to be evaluated, as well. Call ended at 1337    Date: 05/18/13  Rate: 66  Rhythm: normal sinus rhythm  QRS Axis: normal  PR and QT Intervals: normal  ST/T Wave abnormalities: normal  PR and QRS Conduction Disutrbances:nonspecific intraventricular conduction delay  Narrative Interpretation:   Old EKG Reviewed: unchanged- 02/11/10    Labs Reviewed  BASIC METABOLIC PANEL - Abnormal; Notable for the following:    Glucose, Bld 101 (*)    All other components within normal limits  CBC WITH DIFFERENTIAL  URINALYSIS, ROUTINE W REFLEX MICROSCOPIC  TROPONIN I  D-DIMER, QUANTITATIVE   Dg Chest 2 View  05/18/2013   *RADIOLOGY REPORT*  Clinical Data:  Shortness of breath and history of pneumothorax.  CHEST - 2 VIEW  Comparison: None.  Findings: No pneumothorax, infiltrate or edema is identified.  No pleural fluid is seen.  The lungs show mild and diffuse interstitial prominence bilaterally which may be reflective of chronic disease.  The heart size and mediastinal contours are within  normal limits.  The bony thorax is unremarkable.  IMPRESSION: No active disease.  Possible underlying chronic interstitial disease.   Original Report Authenticated By: Irish Lack, M.D.   1. Dyspnea     MDM  Nonspecific chest pain, and ongoing shortness of breath. She is on estrogen for menopausal symptoms. A d-dimer was ordered to rule out PE. Screen for cardiac problems with EKG and troponin. EP evaluation is reassuring. Doubt ACS, PE, or pneumonia. Suspect mild, chronic lung disease, the nonspecific nature. This is best evaluated, as an outpatient with her PCP, or pulmonologist.  Doubt metabolic instability, serious bacterial infection or impending vascular collapse; the patient is stable for discharge.  Nursing Notes Reviewed/ Care Coordinated, and agree without changes. Applicable Imaging Reviewed.  Interpretation of Laboratory Data incorporated into ED treatment   Plan: Home Medications- usual; Home Treatments and Observation- rest, fluids; return here if the recommended treatment, does not improve the symptoms; Recommended follow up- PCP or Pulmonology in 1-2 weeks.    Flint Melter, MD 05/18/13 [phone]

## 2013-05-18 NOTE — ED Notes (Signed)
As I write this, Dr. Effie Shy is examining the pt.  She tells [place] that her PCP has recently performed chest x-ray, which "showed something abnormal; and they said I should get a CT scan".  She further states she has had gradually increasing shortness of breath "for a while now".  She also tells [place] she has had pain at lat. Left thoracic/ribs area x 3 days.  Her skin is normal, warm and dry.  She describes her thoracic pain as "dull" and "constant".

## 2013-05-18 NOTE — ED Notes (Signed)
Pt from home c/o SOB and L sided rib pain. Pt reports that she was seen at her PCP office 1 month ago and xray showed some abnormality. Pt was told that she needed a CT scan but is unable to afford it. Pt states that she has had SOB x6 months and thought it was just allergies. Pt reports a hx of exercise induced asthma. Pt in A&O and in NAD

## 2013-05-23 ENCOUNTER — Institutional Professional Consult (permissible substitution): Payer: 59 | Admitting: Internal Medicine

## 2013-05-23 ENCOUNTER — Other Ambulatory Visit: Payer: 59

## 2013-05-27 ENCOUNTER — Encounter: Payer: Self-pay | Admitting: Emergency Medicine

## 2013-05-27 ENCOUNTER — Ambulatory Visit (INDEPENDENT_AMBULATORY_CARE_PROVIDER_SITE_OTHER): Payer: 59 | Admitting: Emergency Medicine

## 2013-05-27 VITALS — BP 110/80 | HR 68 | Temp 98.7°F | Ht 61.0 in | Wt 141.2 lb

## 2013-05-27 DIAGNOSIS — R0989 Other specified symptoms and signs involving the circulatory and respiratory systems: Secondary | ICD-10-CM

## 2013-05-27 DIAGNOSIS — R0600 Dyspnea, unspecified: Secondary | ICD-10-CM | POA: Insufficient documentation

## 2013-05-27 NOTE — Progress Notes (Signed)
Subjective:    Patient ID: Olivia [name], female    DOB: [birth year], 47 y.o.   MRN: [id number]  HPI 47 yo woman, former tobacco (12 pk-yrs), Hx RA followed by Dr Nickola Major. Also w reported ?  Asthma (no PFT), allergic rhinitis, prior spont PTX in 90's, no chest tube, ? On the L. Also hx Nissen for GERD, 2009.   Was well until 1/14 when she began to experience dyspnea at rest and w exertion. Cough w and without mucous, more allergy sx.   Wt gain of 10 lbs.    Review of Systems  Constitutional: Positive for chills, diaphoresis, activity change and fatigue. Negative for fever and unexpected weight change.  HENT: Positive for congestion. Negative for ear pain, nosebleeds, sore throat, rhinorrhea, sneezing, trouble swallowing, dental problem, postnasal drip and sinus pressure.   Eyes: Negative for redness and itching.  Respiratory: Positive for cough, chest tightness and shortness of breath. Negative for wheezing.   Cardiovascular: Positive for chest pain. Negative for palpitations and leg swelling.  Gastrointestinal: Negative for nausea and vomiting.  Genitourinary: Negative for dysuria.  Musculoskeletal: Negative for joint swelling.  Skin: Positive for rash.  Neurological: Positive for dizziness, light-headedness and headaches.  Hematological: Does not bruise/bleed easily.  Psychiatric/Behavioral: Negative for dysphoric mood. The patient is not nervous/anxious.     Past Medical History  Diagnosis Date  . Migraine headache   . Hyperlipidemia   . Asthma   . Colon polyps   . GERD (gastroesophageal reflux disease)   . IBS (irritable bowel syndrome)   . MVP (mitral valve prolapse)   . Cirrhosis   . Adenomatous colon polyp   . Iron deficiency anemia   . Anxiety and depression   . Spontaneous pneumothorax      Family History  Problem Relation Age of Onset  . Hypertension Father   . Lung cancer Mother   . Diabetes Maternal Grandfather   . Cancer Brother     oral squamous cell  .  Colon polyps Mother   . Colitis Maternal Aunt   . Stroke Mother   . Diverticulitis Brother     also mother     History   Social History  . Marital Status: Divorced    Spouse Name: N/A    Number of Children: 2  . Years of Education: N/A   Occupational History  . reception/ admin assistant    Social History Main Topics  . Smoking status: Former Smoker -- 5.00 packs/day for 20 years    Types: Cigarettes    Quit date: 11/06/1998  . Smokeless tobacco: Never Used  . Alcohol Use: 0.0 oz/week     Comment: socially  . Drug Use: No  . Sexually Active: Yes    Birth Control/ Protection: None   Other Topics Concern  . Not on file   Social History Narrative  . No narrative on file   office work, also lived in [place], [place], [place].  [employer] - [employer]  Allergies  Allergen Reactions  . Darvocet (Propoxyphene-Acetaminophen) Hives  . Hydrocodone Hives  . Oxycodone Hives  . Vicodin (Hydrocodone-Acetaminophen) Hives     Outpatient Prescriptions Prior to Visit  Medication Sig Dispense Refill  . celecoxib (CELEBREX) 400 MG capsule Take 400 mg by mouth daily.      . cetirizine (ZYRTEC) 10 MG tablet Take 10 mg by mouth daily.      Marland Kitchen estradiol (ESTRACE) 1 MG tablet Take 1 mg by mouth daily.      Marland Kitchen  FLUoxetine (PROZAC) 20 MG capsule Take 20 mg by mouth daily.      . medroxyPROGESTERone (PROVERA) 2.5 MG tablet Take 2.5 mg by mouth daily.      . rizatriptan (MAXALT) 10 MG tablet Take 10 mg by mouth every 2 (two) hours as needed. May repeat in 2 hours if needed for migraines.       No facility-administered medications prior to visit.         Objective:   Physical Exam Filed Vitals:   05/27/13 0909  BP: 110/80  Pulse: 68  Temp: 98.7 F (37.1 C)  TempSrc: Oral  Height: 5\' 1"  (1.549 m)  Weight: 141 lb 3.2 oz (64.048 kg)  SpO2: 97%   Gen: Pleasant, well-nourished, in no distress,  normal affect  ENT: No lesions,  mouth clear,  oropharynx clear, no postnasal drip  Neck: No JVD, no  TMG, no carotid bruits  Lungs: No use of accessory muscles, no dullness to percussion, clear without rales or rhonchi  Cardiovascular: RRR, heart sounds normal, no murmur or gallops, no peripheral edema  Musculoskeletal: No deformities, no cyanosis or clubbing  Neuro: alert, non focal  Skin: Warm, no lesions or rashes   05/18/13 --  CHEST - 2 VIEW  Comparison: None.  Findings: No pneumothorax, infiltrate or edema is identified. No  pleural fluid is seen. The lungs show mild and diffuse  interstitial prominence bilaterally which may be reflective of  chronic disease. The heart size and mediastinal contours are  within normal limits. The bony thorax is unremarkable.  IMPRESSION:  No active disease. Possible underlying chronic interstitial  disease.      Assessment & Plan:  Dyspnea Ddx here is broad and includes normal issues like wt gain or deconditioning, but must also consider evolving RA-related ILD, PAH. She carries hx posible asthma so must also consider obstructive lung disease, especially with worsening of her allergies. Will start the workup --    Walking oximetry today did not show desaturation We will perform full pulmonary function testing We will perform an echocardiogram We will perform a CT scan of the chest  Continue your zyrtec. We may consider adding another medication for allergies in the future.  Keep albuterol available to use if needed for shortness of breath Follow with Dr Delton Coombes next available after your testing is completed to review the results.

## 2013-05-27 NOTE — Assessment & Plan Note (Addendum)
Ddx here is broad and includes normal issues like wt gain or deconditioning, but must also consider evolving RA-related ILD, PAH. She carries hx posible asthma so must also consider obstructive lung disease, especially with worsening of her allergies. Will start the workup --    Walking oximetry today did not show desaturation We will perform full pulmonary function testing We will perform an echocardiogram We will perform a CT scan of the chest  Continue your zyrtec. We may consider adding another medication for allergies in the future.  Keep albuterol available to use if needed for shortness of breath Follow with Dr Delton Coombes next available after your testing is completed to review the results.

## 2013-05-27 NOTE — Patient Instructions (Addendum)
Walking oximetry today did not show low oxygen levels We will perform full pulmonary function testing We will perform an echocardiogram We will perform a CT scan of the chest  Continue your zyrtec. We may consider adding another medication for allergies in the future.  Keep albuterol available to use if needed for shortness of breath Follow with Dr Delton Coombes next available after your testing is completed to review the results.

## 2013-05-28 ENCOUNTER — Telehealth: Payer: Self-pay | Admitting: Emergency Medicine

## 2013-05-28 NOTE — Telephone Encounter (Signed)
Pt has decided to cancel her ct chest and the echo she will do the pfts and ducuss them with dr byrum Tobe Sos

## 2013-05-30 ENCOUNTER — Other Ambulatory Visit (HOSPITAL_COMMUNITY): Payer: 59

## 2013-05-30 ENCOUNTER — Other Ambulatory Visit: Payer: 59

## 2013-06-05 ENCOUNTER — Institutional Professional Consult (permissible substitution): Payer: 59 | Admitting: Emergency Medicine

## 2013-06-16 ENCOUNTER — Encounter: Payer: Self-pay | Admitting: Internal Medicine

## 2013-06-20 ENCOUNTER — Ambulatory Visit: Payer: 59 | Admitting: Internal Medicine

## 2013-06-27 ENCOUNTER — Ambulatory Visit (INDEPENDENT_AMBULATORY_CARE_PROVIDER_SITE_OTHER): Payer: 59 | Admitting: Emergency Medicine

## 2013-06-27 ENCOUNTER — Encounter: Payer: Self-pay | Admitting: Emergency Medicine

## 2013-06-27 VITALS — BP 110/78 | HR 77 | Temp 97.5°F | Ht 60.5 in | Wt 141.0 lb

## 2013-06-27 DIAGNOSIS — R0600 Dyspnea, unspecified: Secondary | ICD-10-CM

## 2013-06-27 DIAGNOSIS — R0609 Other forms of dyspnea: Secondary | ICD-10-CM

## 2013-06-27 DIAGNOSIS — J019 Acute sinusitis, unspecified: Secondary | ICD-10-CM | POA: Insufficient documentation

## 2013-06-27 LAB — PULMONARY FUNCTION TEST

## 2013-06-27 MED ORDER — LEVOFLOXACIN 500 MG PO TABS: 500.0000 mg | ORAL_TABLET | Freq: Every day | ORAL | Status: DC

## 2013-06-27 NOTE — Patient Instructions (Addendum)
Your breathing tests do not support overt asthma We will defer your echocardiogram and CT scan until after you obtain your [facility] benefits Continue your Zyrtec and nasal saline Have albuterol available to use as needed.  We will treat your sinusitis with levaquin 500mg  daily x 10 days.  Follow with Olivia [name] in 6 months or sooner if you have any problems

## 2013-06-27 NOTE — Assessment & Plan Note (Signed)
PFT support restriction, not overt asthma although she may have asthmatic tendency in setting allergies. Suspect this due to her conditioning and wt gain. She will still need TTE and CT scan since she has RA. Will do it after she gets [place] benefits.

## 2013-06-27 NOTE — Progress Notes (Signed)
  Subjective:    Patient ID: Olivia [name], female    DOB: [birth year], 47 y.o.   MRN: [id number]  HPI 47 yo woman, former tobacco (12 pk-yrs), Hx RA followed by Dr Nickola Major. Also w reported ?  Asthma (no PFT), allergic rhinitis, prior spont PTX in 90's, no chest tube, ? On the L. Also hx Nissen for GERD, 2009.   Was well until 1/14 when she began to experience dyspnea at rest and w exertion. Cough w and without mucous, more allergy sx.   Wt gain of 10 lbs.   ROV 06/27/13 -- follow up for dyspnea in setting hx allergies, possible asthma. PFT done today . She has been having continued greenish nasal discharge, HA, sometimes blood. Still with exertional sob.    Review of Systems  Constitutional: Positive for chills, diaphoresis, activity change and fatigue. Negative for fever and unexpected weight change.  HENT: Positive for congestion. Negative for ear pain, nosebleeds, sore throat, rhinorrhea, sneezing, trouble swallowing, dental problem, postnasal drip and sinus pressure.   Eyes: Negative for redness and itching.  Respiratory: Positive for cough, chest tightness and shortness of breath. Negative for wheezing.   Cardiovascular: Positive for chest pain. Negative for palpitations and leg swelling.  Gastrointestinal: Negative for nausea and vomiting.  Genitourinary: Negative for dysuria.  Musculoskeletal: Negative for joint swelling.  Skin: Positive for rash.  Neurological: Positive for dizziness, light-headedness and headaches.  Hematological: Does not bruise/bleed easily.  Psychiatric/Behavioral: Negative for dysphoric mood. The patient is not nervous/anxious.       Objective:   Physical Exam Filed Vitals:   06/27/13 1412 06/27/13 1415  BP:  110/78  Pulse:  77  Temp:  97.5 F (36.4 C)  TempSrc:  Oral  Height: 5' 0.5" (1.537 m)   Weight: 141 lb (63.957 kg)   SpO2:  98%   Gen: Pleasant, well-nourished, in no distress,  normal affect  ENT: No lesions,  mouth clear,  oropharynx clear,  no postnasal drip  Neck: No JVD, no TMG, no carotid bruits  Lungs: No use of accessory muscles, no dullness to percussion, clear without rales or rhonchi  Cardiovascular: RRR, heart sounds normal, no murmur or gallops, no peripheral edema  Musculoskeletal: No deformities, no cyanosis or clubbing  Neuro: alert, non focal  Skin: Warm, no lesions or rashes   05/18/13 --  CHEST - 2 VIEW  Comparison: None.  Findings: No pneumothorax, infiltrate or edema is identified. No  pleural fluid is seen. The lungs show mild and diffuse  interstitial prominence bilaterally which may be reflective of  chronic disease. The heart size and mediastinal contours are  within normal limits. The bony thorax is unremarkable.  IMPRESSION:  No active disease. Possible underlying chronic interstitial  disease.      Assessment & Plan:  Dyspnea PFT support restriction, not overt asthma although she may have asthmatic tendency in setting allergies. Suspect this due to her conditioning and wt gain. She will still need TTE and CT scan since she has RA. Will do it after she gets [place] benefits.   Acute sinusitis levaquin x 10 days Continue existing allergy regimen.

## 2013-06-27 NOTE — Progress Notes (Signed)
PFT done today. 

## 2013-06-27 NOTE — Assessment & Plan Note (Signed)
levaquin x 10 days Continue existing allergy regimen.

## 2013-07-08 ENCOUNTER — Encounter: Payer: Self-pay | Admitting: Emergency Medicine

## 2013-07-08 ENCOUNTER — Telehealth: Payer: Self-pay | Admitting: Emergency Medicine

## 2013-07-08 NOTE — Telephone Encounter (Signed)
We received these messages from the pt today through MyChart:  Good afternoon, Dr. Delton Coombes.  1) I have finished the antibiotic, and I feel better, but I still have a sense of congested in my nasal passages and a non productive to semi productive "wet" cough.  Should I still have these symptoms after a 10 day round of antibiotic?   2) I have been scheduled with a primary care physician At the [facility] for 07/18/13.    Could you possibly draft a summary explaining what's been done/what needs to be done so I can ask the primary physician to fast track me straight through to testing the 12th, instead of returning at a later date to be referred by a specialist?  It would be great if I didn't have to wait any longer.   RB - please advise. Thanks.

## 2013-07-09 MED ORDER — FLUTICASONE PROPIONATE 50 MCG/ACT NA SUSP: 2.0000 | Freq: Every day | NASAL | Status: AC

## 2013-07-09 NOTE — Telephone Encounter (Signed)
Spoke with patient-- Advised of recs per RB below Patient does not have name or number of MD at [facility] at time will call  Back with this information

## 2013-07-09 NOTE — Telephone Encounter (Signed)
ATC patient--  No answer, LMOMTCB Need to find out who the actual MD she needs these notes to go to.

## 2013-07-09 NOTE — Telephone Encounter (Signed)
1- likely need to add NSW daily and fluticasone nasal spray bid to her zyrtec.  2- we will send her notes to her to go to the [place] to summarize her workup and the remaining testing to do.

## 2013-07-09 NOTE — Telephone Encounter (Signed)
Pt returned call and can be reached @ [phone]. Olivia [name]

## 2013-07-10 ENCOUNTER — Telehealth: Payer: Self-pay | Admitting: Emergency Medicine

## 2013-07-10 NOTE — Telephone Encounter (Signed)
Thank you :)

## 2013-07-10 NOTE — Telephone Encounter (Signed)
Will forward to Lauren and Dr. Delton Coombes as an Lorain Childes

## 2013-07-11 NOTE — Telephone Encounter (Signed)
Office Notes have been faxed to [phone] Attn: Dr. Shane Crutch  (telephone # [phone]) Nothing further needed at this time

## 2013-07-17 ENCOUNTER — Telehealth: Payer: Self-pay | Admitting: Emergency Medicine

## 2013-07-17 NOTE — Telephone Encounter (Signed)
Lazarus Salines, RN at 07/11/2013 11:58 AM   Status: Signed            Office Notes have been faxed to [phone] Attn: Dr. Shane Crutch (telephone # [phone])  Nothing further needed at this time    lmomtcb x1 on VM

## 2013-07-17 NOTE — Telephone Encounter (Signed)
Pt called back. I advised her of this. Nothing further needed

## 2013-07-18 ENCOUNTER — Telehealth: Payer: Self-pay | Admitting: Emergency Medicine

## 2013-07-18 NOTE — Telephone Encounter (Signed)
Spoke with patient, patient states she had visit with MD at the [place] today. The doctor there stated she did not receive OV notes that were faxed on 9/5 per telephone note Patient requesting OV notes be mailed to her home so she can carry them herself to MD at [facility] when she has next OV there Notes have been printed out and placed in envelope upfront in outgoing mail Patient aware they will be mailed out Monday morning Nothing further needed at this time.

## 2013-09-11 ENCOUNTER — Other Ambulatory Visit: Payer: Self-pay

## 2014-06-10 IMAGING — CT CT MAXILLOFACIAL W/O CM
3 series · 16 of 47 positions shown, 19 images · non-contrast
Comparison: 04/15/2012 head CT.  06/17/2007 sinus CT.

CLINICAL DATA: Three prior nose surgeries.  Hit on the left side by
a puppy.  Pain.

CT MAXILLOFACIAL WITHOUT CONTRAST
TECHNIQUE: Multidetector CT imaging of the maxillofacial
structures was performed. Multiplanar CT image reconstructions were
also generated.

[Series 2: facial st · axial · 0.31mm/px · z∈[-462,-332]mm · 10 of 77 slices shown, 13 images]
[im 6/77  brain]
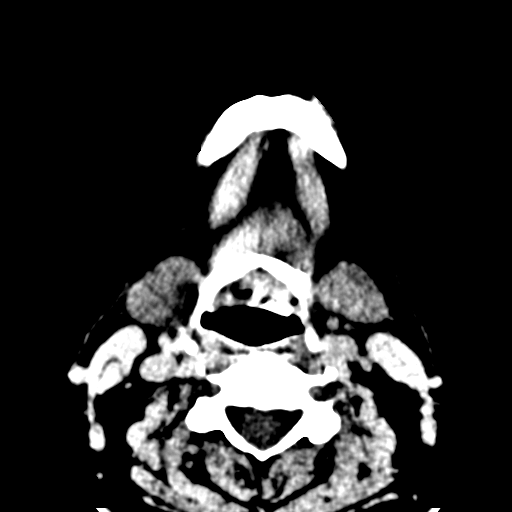
[im 6/77  bone]
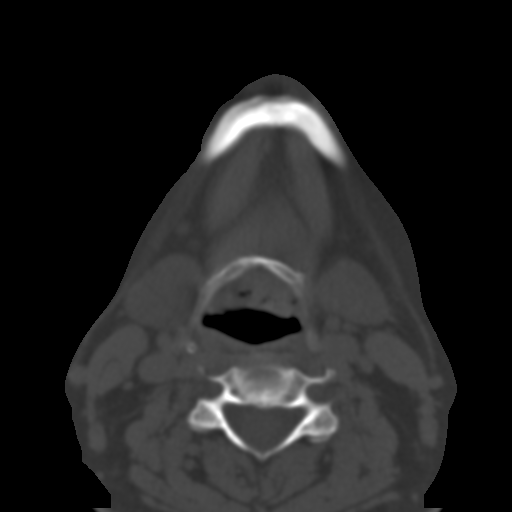
[im 14/77  bone]
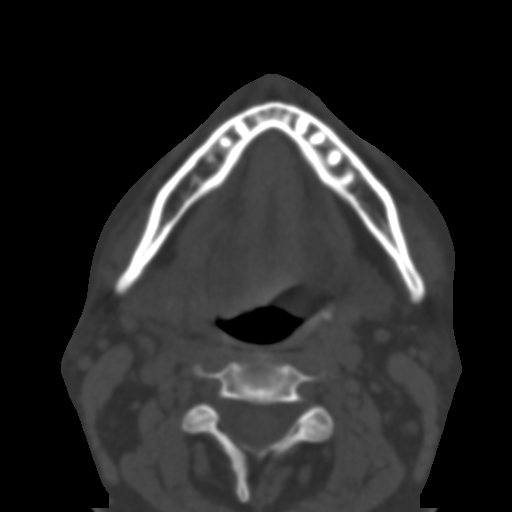
[im 21/77  bone]
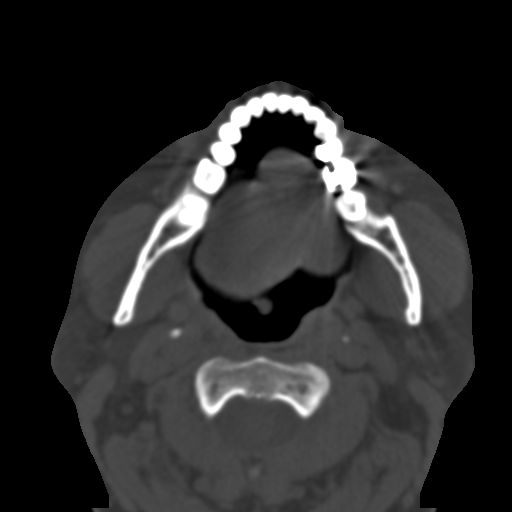
[im 27/77  bone]
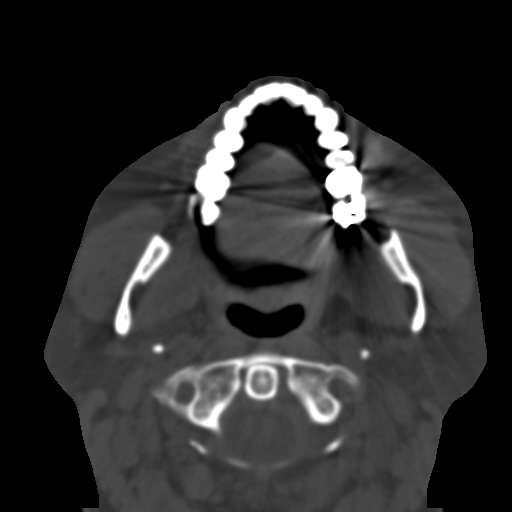
[im 35/77  brain]
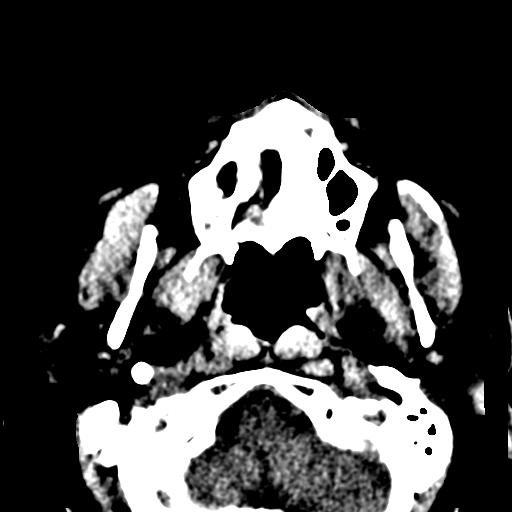
[im 35/77  bone]
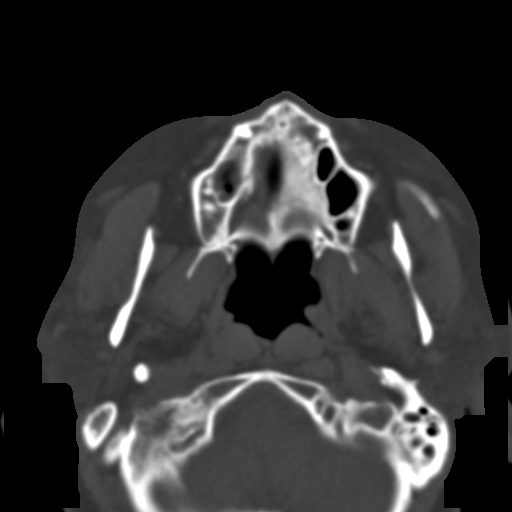
[im 42/77  bone]
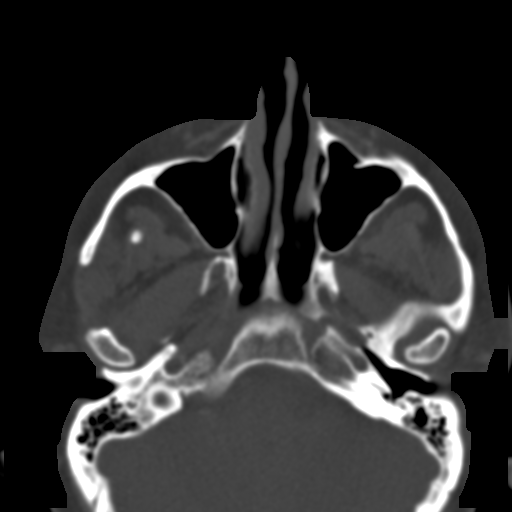
[im 50/77  bone]
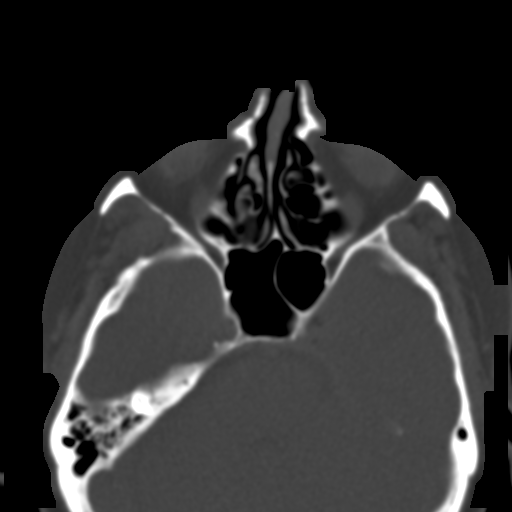
[im 58/77  bone]
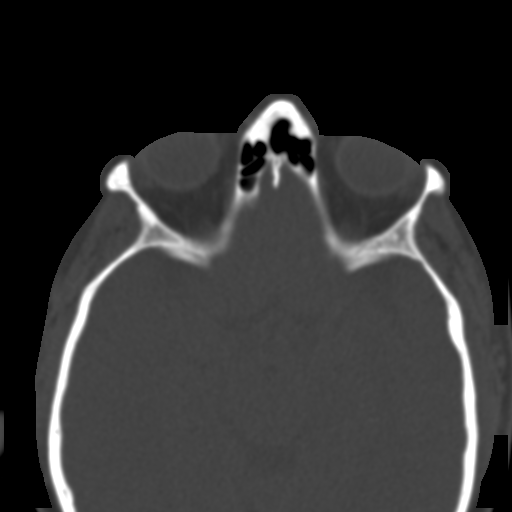
[im 63/77  brain]
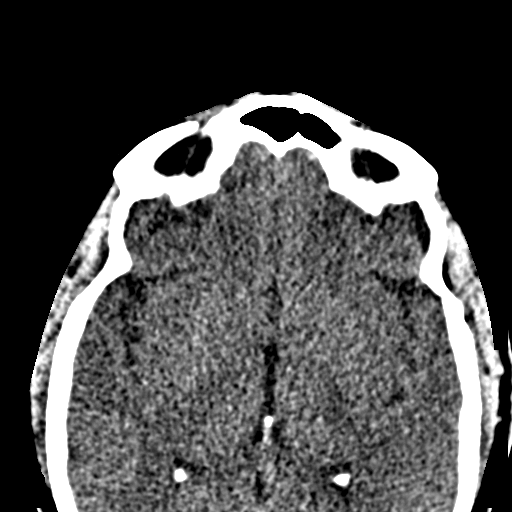
[im 63/77  bone]
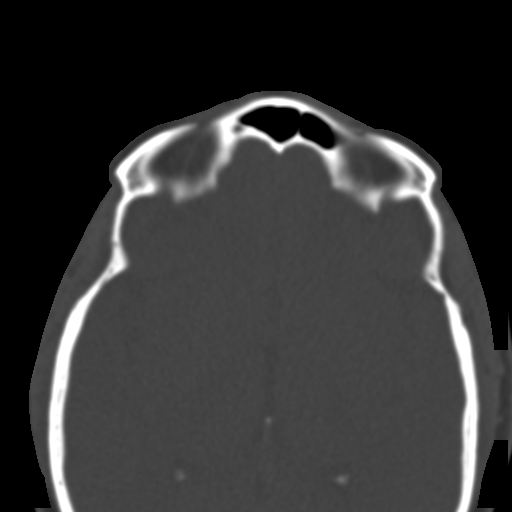
[im 71/77  bone]
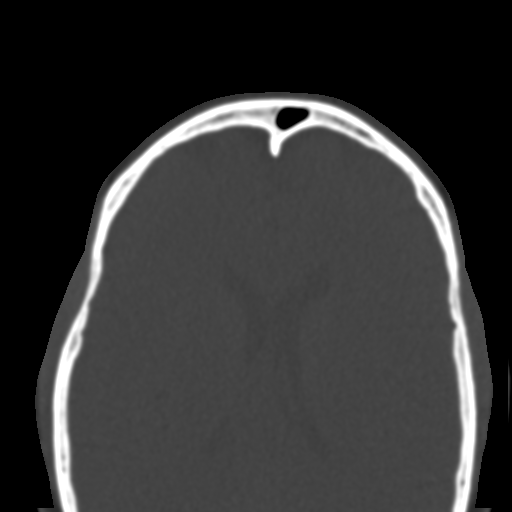

[Series 7: sagittal st · sagittal · 0.30mm/px · 3 of 75 slices shown]
[im 25/75  bone]
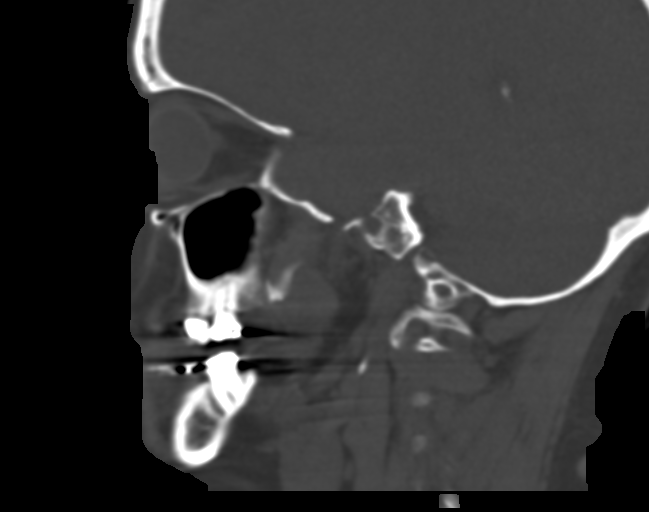
[im 38/75  bone]
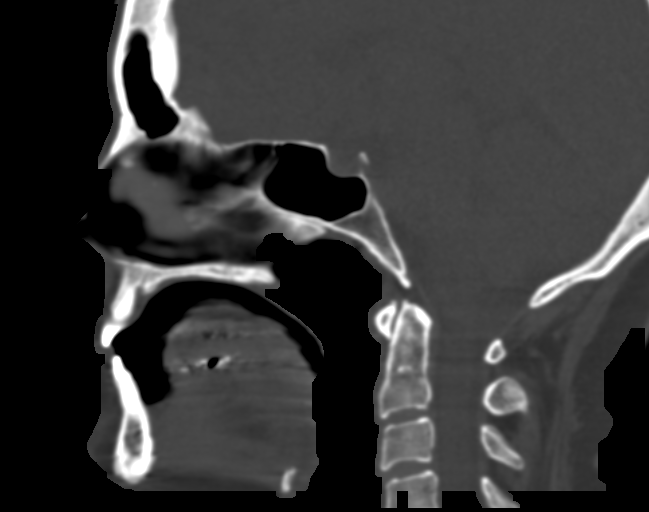
[im 50/75  bone]
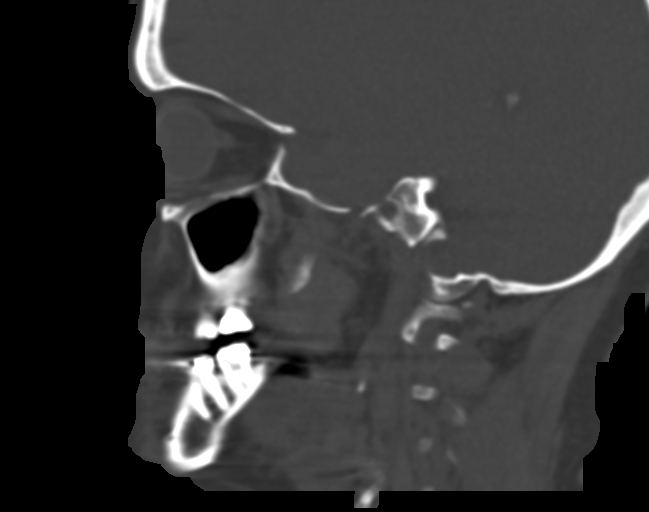

[Series 8: coronal bone · coronal · 0.30mm/px · 3 of 49 slices shown]
[im 17/49  bone]
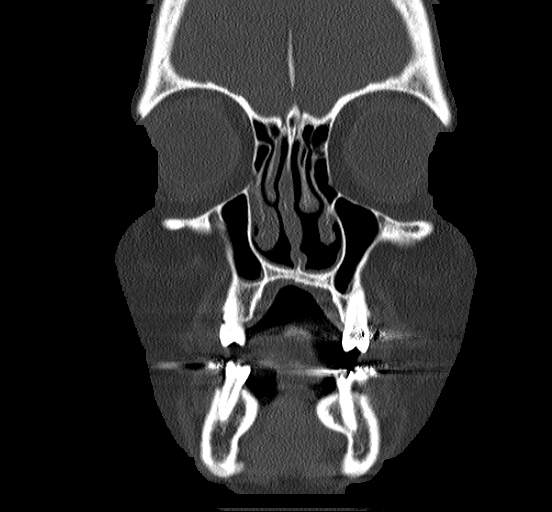
[im 22/49  bone]
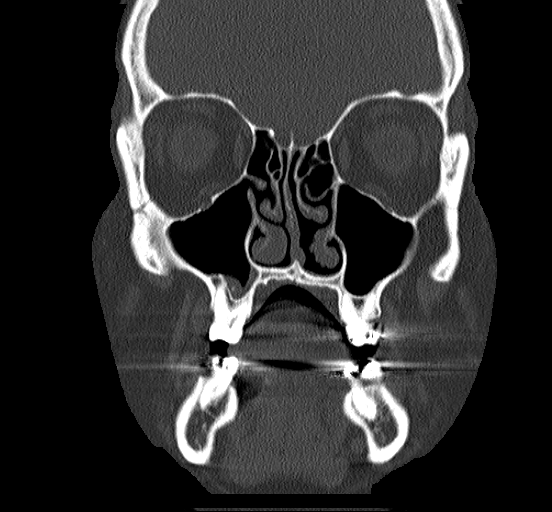
[im 27/49  bone]
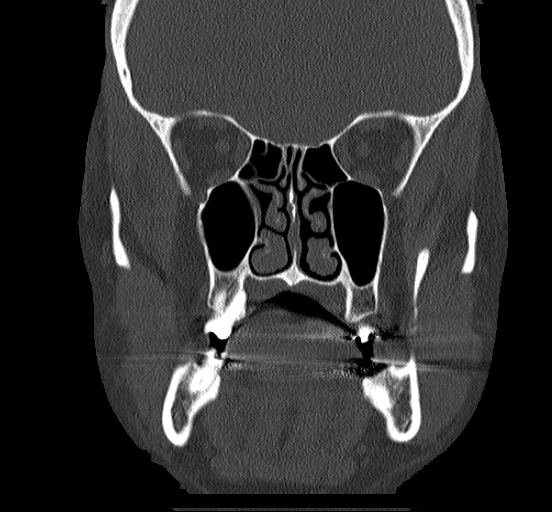

[16 of 47 positions shown; findings below may reference images not displayed]

FINDINGS: Right nasal bone subtle fracture with minimal
displacement appears remote and similar to the 06/17/2007 exam.  No
acute fractures noted.

Mild mucosal thickening inferior aspect right maxillary sinus.
Minimal mucosal thickening inferior posterior left sphenoid sinus.

Visualized orbital structures and intracranial structures
unremarkable.
IMPRESSION: No acute fracture.  Please see above.

## 2014-11-14 IMAGING — CR DG CHEST 2V
2 series · 2 of 2 positions shown · non-contrast
Comparison: None.

CLINICAL DATA: Shortness of breath and history of pneumothorax.

CHEST - 2 VIEW

[w chest pa]
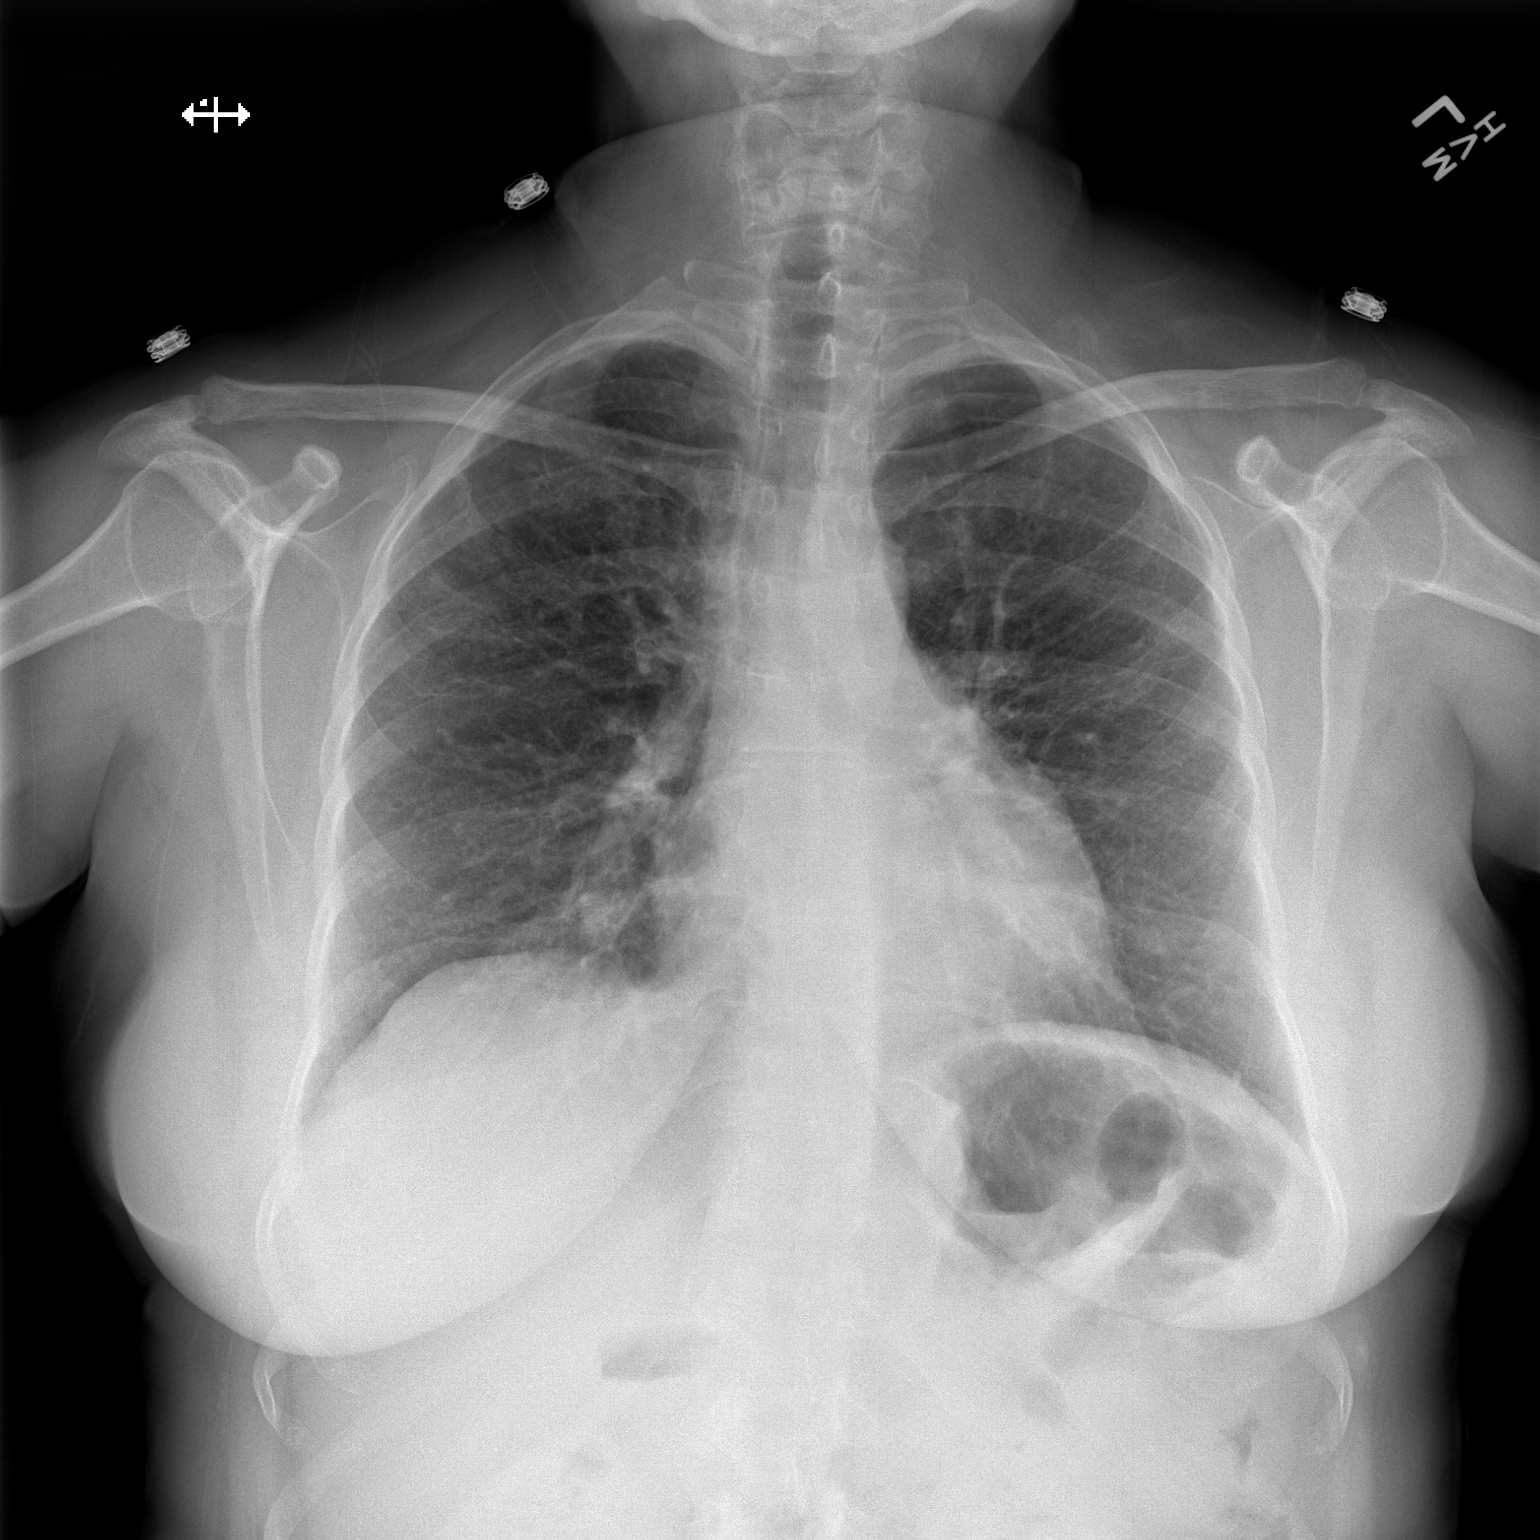

[w chest lat]
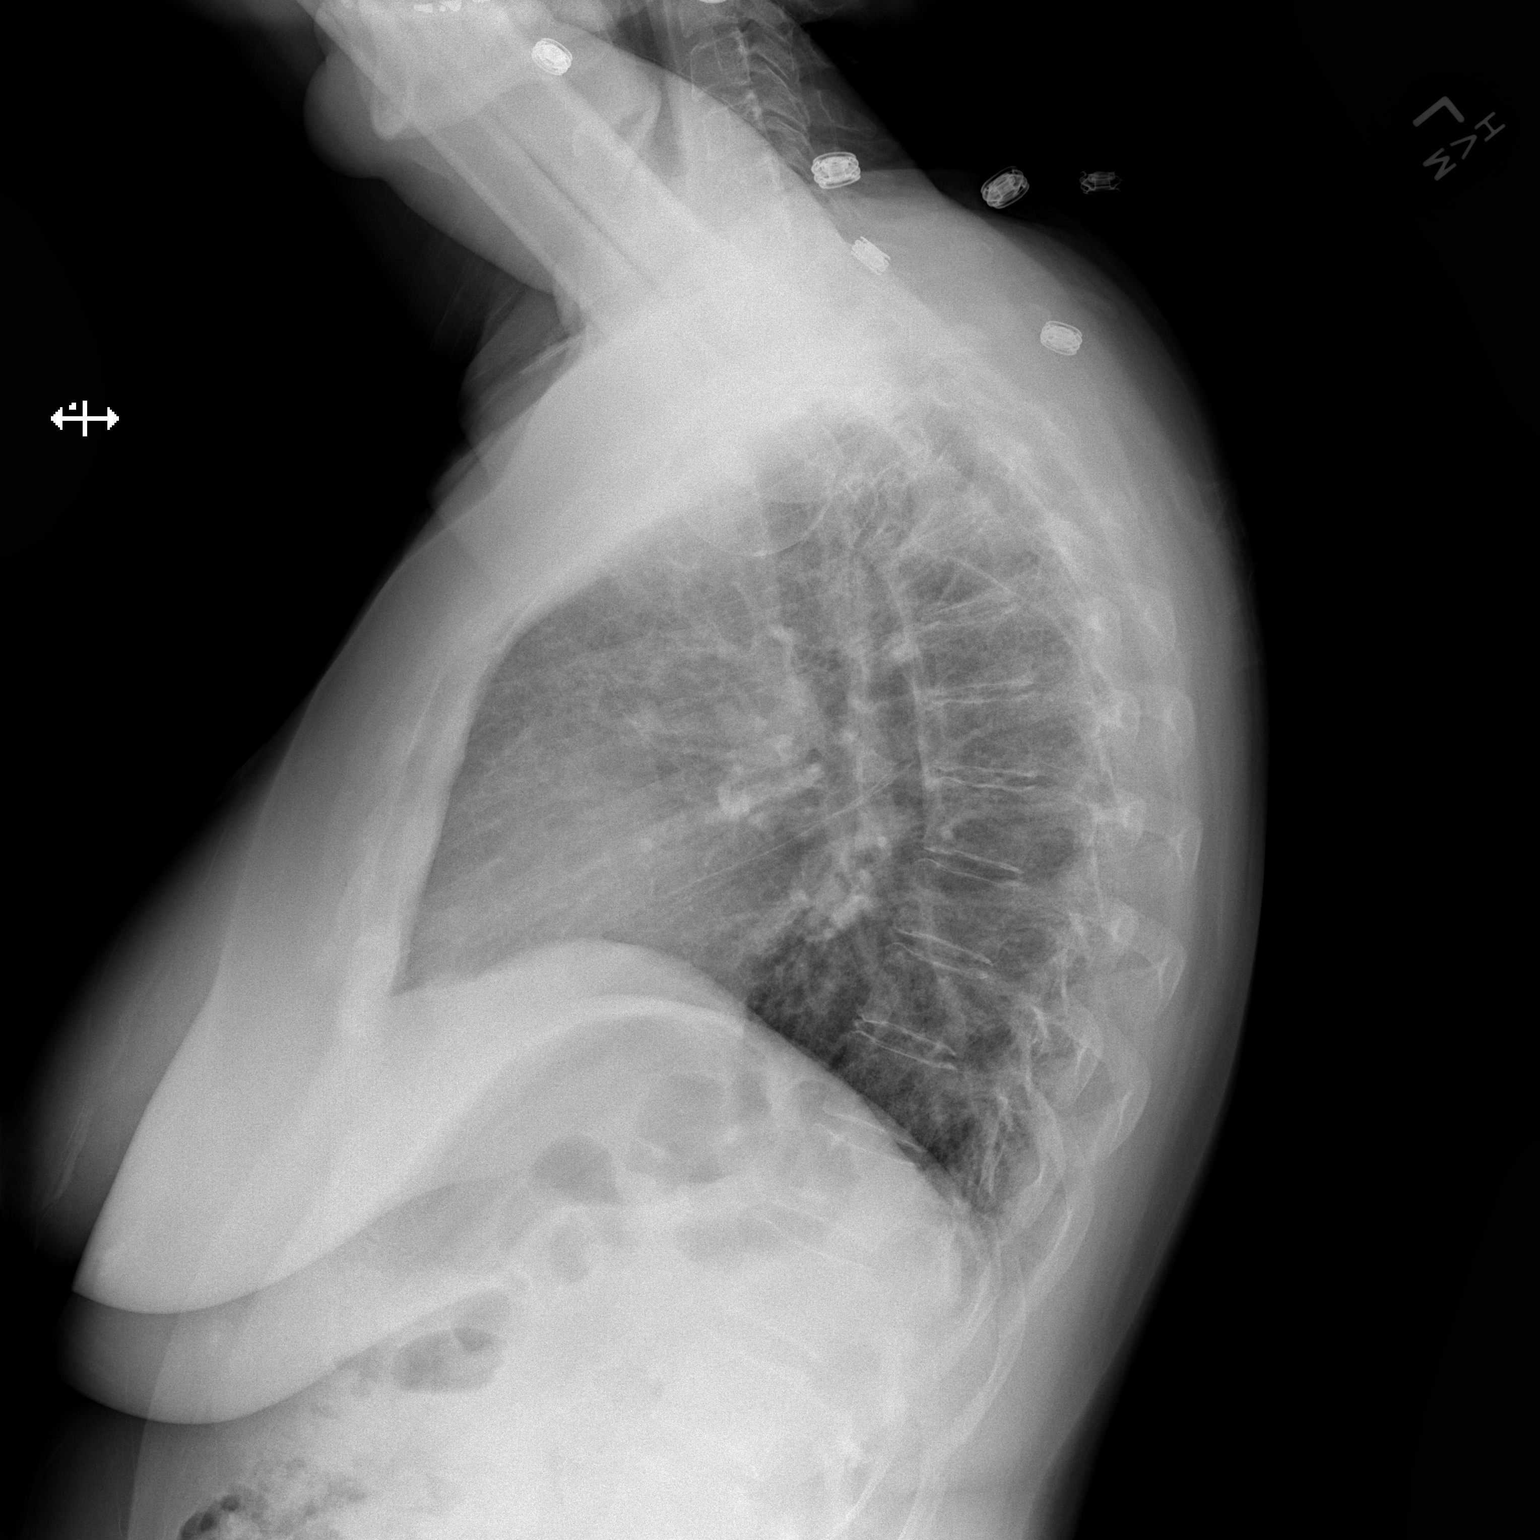

[2 of 2 positions shown; findings below may reference images not displayed]

FINDINGS: No pneumothorax, infiltrate or edema is identified.  No
pleural fluid is seen.  The lungs show mild and diffuse
interstitial prominence bilaterally which may be reflective of
chronic disease.  The heart size and mediastinal contours are
within normal limits.  The bony thorax is unremarkable.
IMPRESSION: No active disease.  Possible underlying chronic interstitial
disease.

## 2015-09-23 ENCOUNTER — Other Ambulatory Visit: Payer: Self-pay | Admitting: Obstetrics & Gynecology

## 2015-09-24 LAB — CYTOLOGY - PAP

## 2016-03-24 ENCOUNTER — Telehealth (HOSPITAL_COMMUNITY): Payer: Self-pay

## 2016-03-24 NOTE — Telephone Encounter (Signed)
Patient called to cancel Pulmonary Rehab orientation due to cracking two ribs. Patient states that when she is cleared by her doctor, she will call to reschedule orientation appointment.

## 2016-03-27 ENCOUNTER — Ambulatory Visit (HOSPITAL_COMMUNITY): Payer: Self-pay

## 2016-05-08 ENCOUNTER — Encounter (HOSPITAL_COMMUNITY): Payer: Self-pay

## 2016-05-08 ENCOUNTER — Encounter (HOSPITAL_COMMUNITY)
Admission: RE | Admit: 2016-05-08 | Discharge: 2016-05-08 | Disposition: A | Discharge status: Home / Self Care | Payer: No Typology Code available for payment source | Source: Ambulatory Visit | Attending: Pulmonary Disease | Admitting: Pulmonary Disease

## 2016-05-08 VITALS — BP 117/72 | HR 84 | Resp 22 | Ht 60.25 in | Wt 155.2 lb

## 2016-05-08 DIAGNOSIS — J8410 Pulmonary fibrosis, unspecified: Secondary | ICD-10-CM | POA: Diagnosis not present

## 2016-05-08 HISTORY — DX: Other specified interstitial pulmonary diseases: J84.89

## 2016-05-08 HISTORY — DX: Pulmonary fibrosis, unspecified: J84.10

## 2016-05-08 NOTE — Progress Notes (Signed)
Shyrl Numbers 50 y.o. female Pulmonary Rehab Orientation Note Patient arrived today in Cardiac and Pulmonary Rehab for orientation to Pulmonary Rehab. She ambulated from [employer]. She does carry portable oxygen, but is inconsistent with when she wears it. Per pt, she uses oxygen intermittently. She also is not consistent with her CPAP use. She states the pressures are to forceful. Will contact Dr. Gwenette Greet at [facility] to see if he can adjust her pressures. Color good, skin warm and dry. Patient is oriented to time and place. Patient's medical history, psychosocial health, and medications reviewed. Psychosocial assessment reveals pt lives with their daughter. Pt is currently working part-time out of her home. She states that Dr. Gwenette Greet has encouraged disability, however she is not ready to give up her job. It is something she enjoys and it helps her to not focus on the progression of her disease. Pt hobbies include spending time with her daughters. Pt reports her stress level is low in spite of her illness. Areas of stress/anxiety include Health.  Pt does not exhibits signs of depression. Signs of depression include sadness at time. Her sadness comes from the thought of her daughters worrying about her. PHQ2/9 score 0/na. Pt shows good  coping skills with positive outlook . She was  offered emotional support and reassurance. Will continue to monitor and evaluate progress toward psychosocial goal(s) of remaining positive and realistic about her future. Physical assessment reveals heart rate is normal, breath sounds clear to auscultation, no wheezes, rales, or rhonchi, diminished. Grip strength equal, strong. Distal pulses palpable. Patient reports she does take medications as prescribed. Patient states she follows a Regular diet. The patient reports no specific efforts to gain or lose weight.. Patient's weight will be monitored closely. Demonstration and practice of PLB using pulse oximeter. Patient able to  return demonstration satisfactorily. Safety and hand hygiene in the exercise area reviewed with patient. Patient voices understanding of the information reviewed. Department expectations discussed with patient and achievable goals were set. The patient shows enthusiasm about attending the program and we look forward to working with this nice lady. The patient is scheduled for a 6 min walk test on Thursday 7/6 and to begin exercise on Thursday 7/13 in the 1030 class.

## 2016-05-11 ENCOUNTER — Encounter (HOSPITAL_COMMUNITY)
Admission: RE | Admit: 2016-05-11 | Discharge: 2016-05-11 | Disposition: A | Discharge status: Home / Self Care | Payer: No Typology Code available for payment source | Source: Ambulatory Visit | Attending: Pulmonary Disease | Admitting: Pulmonary Disease

## 2016-05-11 DIAGNOSIS — J8410 Pulmonary fibrosis, unspecified: Secondary | ICD-10-CM | POA: Diagnosis not present

## 2016-05-11 NOTE — Progress Notes (Signed)
Pulmonary Individual Treatment Plan  Patient Details  Name: Olivia [name] MRN: [id number] Date of Birth: 14-Aug-1966 Referring Provider:        Pulmonary Rehab Walk Test from 05/11/2016 in [place]   Referring Provider  Dr. Gwenette Greet      Initial Encounter Date:       Pulmonary Rehab Walk Test from 05/11/2016 in [place]   Date  05/11/16   Referring Provider  Dr. Gwenette Greet      Visit Diagnosis: Pulmonary fibrosis, unspecified ([place])  Patient's Home Medications on Admission:   Current outpatient prescriptions:  .  calcium-vitamin D (OSCAL WITH D) 500-200 MG-UNIT tablet, Take 1 tablet by mouth., Disp: , Rfl:  .  cetirizine (ZYRTEC) 10 MG tablet, Take 10 mg by mouth daily., Disp: , Rfl:  .  codeine 30 MG tablet, Take 30 mg by mouth every 6 (six) hours as needed (cough)., Disp: , Rfl:  .  dextromethorphan-guaiFENesin (MUCINEX DM) 30-600 MG 12hr tablet, Take 1 tablet by mouth 2 (two) times daily as needed for cough., Disp: , Rfl:  .  estradiol (ESTRACE) 1 MG tablet, Take 1 mg by mouth daily., Disp: , Rfl:  .  FLUoxetine (PROZAC) 20 MG capsule, Take 40 mg by mouth 2 (two) times daily. , Disp: , Rfl:  .  fluticasone (FLONASE) 50 MCG/ACT nasal spray, Place 2 sprays into the nose daily., Disp: 16 g, Rfl: 2 .  medroxyPROGESTERone (PROVERA) 2.5 MG tablet, Take 2.5 mg by mouth daily., Disp: , Rfl:  .  mycophenolate (CELLCEPT) 500 MG tablet, Take 1,000 mg by mouth 2 (two) times daily., Disp: , Rfl:  .  pantoprazole (PROTONIX) 20 MG tablet, Take 20 mg by mouth 2 (two) times daily., Disp: , Rfl:  .  phenylephrine (SUDAFED PE) 10 MG TABS tablet, Take 10 mg by mouth daily., Disp: , Rfl:  .  predniSONE (DELTASONE) 20 MG tablet, Take 20 mg by mouth daily with breakfast., Disp: , Rfl:  .  rizatriptan (MAXALT) 10 MG tablet, Take 10 mg by mouth every 2 (two) hours as needed. May repeat in 2 hours if needed for migraines., Disp: , Rfl:  .   sulfamethoxazole-trimethoprim (BACTRIM DS,SEPTRA DS) 800-160 MG tablet, Take 1 tablet by mouth 3 (three) times a week., Disp: , Rfl:   Past Medical History: Past Medical History  Diagnosis Date  . Migraine headache   . Hyperlipidemia   . Asthma   . Colon polyps   . GERD (gastroesophageal reflux disease)   . IBS (irritable bowel syndrome)   . MVP (mitral valve prolapse)   . Cirrhosis ([place])   . Adenomatous colon polyp   . Iron deficiency anemia   . Anxiety and depression   . Spontaneous pneumothorax   . Pulmonary fibrosis, unspecified ([place])   . NSIP (nonspecific interstitial pneumonia) ([place])     Tobacco Use: History  Smoking status  . Former Smoker -- 5.00 packs/day for 20 years  . Types: Cigarettes  . Quit date: 11/06/1998  Smokeless tobacco  . Never Used    Labs: Recent Review Flowsheet Data    Labs for ITP Cardiac and Pulmonary Rehab Latest Ref Rng 02/01/2010   Cholestrol 0-200 mg/dL 205(H)   LDLDIRECT - 139.1   HDL >39.00 mg/dL 63.10   Trlycerides 0.0-149.0 mg/dL 107.0      Capillary Blood Glucose: No results found for: GLUCAP   ADL UCSD:   Pulmonary Function Assessment:   Exercise Target Goals:  Date: 05/11/16  Exercise Program Goal: Individual exercise prescription set with THRR, safety & activity barriers. Participant demonstrates ability to understand and report RPE using BORG scale, to self-measure pulse accurately, and to acknowledge the importance of the exercise prescription.  Exercise Prescription Goal: Starting with aerobic activity 30 plus minutes a day, 3 days per week for initial exercise prescription. Provide home exercise prescription and guidelines that participant acknowledges understanding prior to discharge.  Activity Barriers & Risk Stratification:     Activity Barriers & Cardiac Risk Stratification - 05/08/16 1123    Activity Barriers & Cardiac Risk Stratification   Activity Barriers Deconditioning;Shortness of Breath;Joint  Problems      6 Minute Walk:     6 Minute Walk      05/11/16 1638       6 Minute Walk   Phase Initial     Distance 1005 feet     Walk Time 6 minutes     # of Rest Breaks 0     MPH 1.9     METS 2.45     RPE 11     Perceived Dyspnea  1     Symptoms No     Resting HR 103 bpm     Resting BP 122/70 mmHg     Max Ex. HR 119 bpm     Max Ex. BP 110/70 mmHg     Interval HR   Baseline HR 103     1 Minute HR 112     2 Minute HR 119     3 Minute HR 118     4 Minute HR 115     5 Minute HR 116     6 Minute HR 101     2 Minute Post HR 96     Interval Heart Rate? Yes     Interval Oxygen   Interval Oxygen? Yes     Baseline Oxygen Saturation % 103 %     Baseline Liters of Oxygen 3 L     1 Minute Oxygen Saturation % 91 %     1 Minute Liters of Oxygen 3 L     2 Minute Oxygen Saturation % 86 %     2 Minute Liters of Oxygen 3 L     3 Minute Oxygen Saturation % 90 %     3 Minute Liters of Oxygen 4 L     4 Minute Oxygen Saturation % 88 %     4 Minute Liters of Oxygen 6 L     5 Minute Oxygen Saturation % 90 %     5 Minute Liters of Oxygen 6 L     6 Minute Oxygen Saturation % 98 %     6 Minute Liters of Oxygen 6 L     2 Minute Post Oxygen Saturation % 98 %     2 Minute Post Liters of Oxygen 6 L        Initial Exercise Prescription:     Initial Exercise Prescription - 05/11/16 1600    Date of Initial Exercise RX and Referring Provider   Date 05/11/16   Referring Provider Dr. Gwenette Greet   NuStep   Level 1   Minutes 17   METs 1.7   Arm Ergometer   Level 1   Minutes 17   Track   Laps 5   Minutes 17   Prescription Details   Frequency (times per week) 2   Duration Progress to 45 minutes of aerobic exercise without  signs/symptoms of physical distress   Intensity   THRR 40-80% of Max Heartrate 68-135   Perceived Dyspnea 0-4   Progression   Progression Continue progressive overload as per policy without signs/symptoms or physical distress.   Resistance Training   Training  Prescription Yes   Weight orange bands   Reps 10-12      Perform Capillary Blood Glucose checks as needed.  Exercise Prescription Changes:   Exercise Comments:   Discharge Exercise Prescription (Final Exercise Prescription Changes):    Nutrition:  Target Goals: Understanding of nutrition guidelines, daily intake of sodium 1500mg , cholesterol 200mg , calories 30% from fat and 7% or less from saturated fats, daily to have 5 or more servings of fruits and vegetables.  Biometrics:     Pre Biometrics - 05/08/16 1156    Pre Biometrics   Grip Strength 30 kg       Nutrition Therapy Plan and Nutrition Goals:   Nutrition Discharge: Rate Your Plate Scores:   Psychosocial: Target Goals: Acknowledge presence or absence of depression, maximize coping skills, provide positive support system. Participant is able to verbalize types and ability to use techniques and skills needed for reducing stress and depression.  Initial Review & Psychosocial Screening:     Initial Psych Review & Screening - 05/08/16 [place]? Yes   Barriers   Psychosocial barriers to participate in program There are no identifiable barriers or psychosocial needs.   Screening Interventions   Interventions Encouraged to exercise      Quality of Life Scores:   PHQ-9:     Recent Review Flowsheet Data    Depression screen [facility] 2/9 05/08/2016   Decreased Interest 0   Down, Depressed, Hopeless 0   PHQ - 2 Score 0      Psychosocial Evaluation and Intervention:   Psychosocial Re-Evaluation:  Education: Education Goals: Education classes will be provided on a weekly basis, covering required topics. Participant will state understanding/return demonstration of topics presented.  Learning Barriers/Preferences:     Learning Barriers/Preferences - 05/08/16 1124    Learning Barriers/Preferences   Learning Barriers None   Learning Preferences Written  Material;Individual Instruction;Group Instruction      Education Topics: Risk Factor Reduction:  -Group instruction that is supported by a PowerPoint presentation. Instructor discusses the definition of a risk factor, different risk factors for pulmonary disease, and how the heart and lungs work together.     Nutrition for Pulmonary Patient:  -Group instruction provided by PowerPoint slides, verbal discussion, and written materials to support subject matter. The instructor gives an explanation and review of healthy diet recommendations, which includes a discussion on weight management, recommendations for fruit and vegetable consumption, as well as protein, fluid, caffeine, fiber, sodium, sugar, and alcohol. Tips for eating when patients are short of breath are discussed.   Pursed Lip Breathing:  -Group instruction that is supported by demonstration and informational handouts. Instructor discusses the benefits of pursed lip and diaphragmatic breathing and detailed demonstration on how to preform both.     Oxygen Safety:  -Group instruction provided by PowerPoint, verbal discussion, and written material to support subject matter. There is an overview of "What is Oxygen" and "Why do we need it".  Instructor also reviews how to create a safe environment for oxygen use, the importance of using oxygen as prescribed, and the risks of noncompliance. There is a brief discussion on traveling with oxygen and resources the patient may utilize.   Oxygen  Equipment:  -Group instruction provided by [employer] Staff utilizing handouts, written materials, and equipment demonstrations.   Signs and Symptoms:  -Group instruction provided by written material and verbal discussion to support subject matter. Warning signs and symptoms of infection, stroke, and heart attack are reviewed and when to call the physician/911 reinforced. Tips for preventing the spread of infection discussed.   Advanced Directives:   -Group instruction provided by verbal instruction and written material to support subject matter. Instructor reviews Advanced Directive laws and proper instruction for filling out document.   Pulmonary Video:  -Group video education that reviews the importance of medication and oxygen compliance, exercise, good nutrition, pulmonary hygiene, and pursed lip and diaphragmatic breathing for the pulmonary patient.   Exercise for the Pulmonary Patient:  -Group instruction that is supported by a PowerPoint presentation. Instructor discusses benefits of exercise, core components of exercise, frequency, duration, and intensity of an exercise routine, importance of utilizing pulse oximetry during exercise, safety while exercising, and options of places to exercise outside of rehab.     Pulmonary Medications:  -Verbally interactive group education provided by instructor with focus on inhaled medications and proper administration.   Anatomy and Physiology of the Respiratory System and Intimacy:  -Group instruction provided by PowerPoint, verbal discussion, and written material to support subject matter. Instructor reviews respiratory cycle and anatomical components of the respiratory system and their functions. Instructor also reviews differences in obstructive and restrictive respiratory diseases with examples of each. Intimacy, Sex, and Sexuality differences are reviewed with a discussion on how relationships can change when diagnosed with pulmonary disease. Common sexual concerns are reviewed.   Knowledge Questionnaire Score:   Core Components/Risk Factors/Patient Goals at Admission:     Personal Goals and Risk Factors at Admission - 05/08/16 1116    Core Components/Risk Factors/Patient Goals on Admission    Weight Management Yes;Weight Loss   Intervention Weight Management: Develop a combined nutrition and exercise program designed to reach desired caloric intake, while maintaining appropriate  intake of nutrient and fiber, sodium and fats, and appropriate energy expenditure required for the weight goal.   Admit Weight 155 lb 3.3 oz (70.4 kg)   Increase Strength and Stamina Yes   Intervention Provide advice, education, support and counseling about physical activity/exercise needs.;Develop an individualized exercise prescription for aerobic and resistive training based on initial evaluation findings, risk stratification, comorbidities and participant's personal goals.   Expected Outcomes Achievement of increased cardiorespiratory fitness and enhanced flexibility, muscular endurance and strength shown through measurements of functional capacity and personal statement of participant.   Improve shortness of breath with ADL's Yes   Intervention Provide education, individualized exercise plan and daily activity instruction to help decrease symptoms of SOB with activities of daily living.   Expected Outcomes Short Term: Achieves a reduction of symptoms when performing activities of daily living.   Develop more efficient breathing techniques such as purse lipped breathing and diaphragmatic breathing; and practicing self-pacing with activity Yes   Intervention Provide education, demonstration and support about specific breathing techniuqes utilized for more efficient breathing. Include techniques such as pursed lipped breathing, diaphragmatic breathing and self-pacing activity.   Expected Outcomes Short Term: Participant will be able to demonstrate and use breathing techniques as needed throughout daily activities.   Increase knowledge of respiratory medications and ability to use respiratory devices properly  Yes   Intervention Provide education and demonstration as needed of appropriate use of medications, inhalers, and oxygen therapy.   Expected Outcomes Short Term: Achieves  understanding of medications use. Understands that oxygen is a medication prescribed by physician. Demonstrates appropriate  use of inhaler and oxygen therapy.      Core Components/Risk Factors/Patient Goals Review:    Core Components/Risk Factors/Patient Goals at Discharge (Final Review):    ITP Comments:   Comments:

## 2016-05-18 ENCOUNTER — Encounter (HOSPITAL_COMMUNITY)
Admission: RE | Admit: 2016-05-18 | Discharge: 2016-05-18 | Disposition: A | Discharge status: Home / Self Care | Payer: No Typology Code available for payment source | Source: Ambulatory Visit | Attending: Pulmonary Disease | Admitting: Pulmonary Disease

## 2016-05-18 VITALS — Wt 155.2 lb

## 2016-05-18 DIAGNOSIS — J8410 Pulmonary fibrosis, unspecified: Secondary | ICD-10-CM | POA: Diagnosis not present

## 2016-05-18 NOTE — Progress Notes (Signed)
Daily Session Note  Patient Details  Name: Olivia [name] MRN: [id number] Date of Birth: [birth year] Referring Provider:        Pulmonary Rehab Walk Test from 05/11/2016 in [place]   Referring Provider  Dr. Gwenette Greet      Encounter Date: 05/18/2016  Check In:     Session Check In - 05/18/16 1228    Check-In   Location [facility]   Staff Present Rosebud Poles, RN, BSN;Onalee Steinbach Ysidro Evert, Felipe Drone, RN, MHA;Portia Rollene Rotunda, RN, BSN   Supervising physician immediately available to respond to emergencies [facility] immediately available   Physician(s) Dr. Lois Huxley   Medication changes reported     No   Fall or balance concerns reported    No   Warm-up and Cool-down Performed as group-led instruction   Resistance Training Performed Yes   VAD Patient? No   Pain Assessment   Currently in Pain? No/denies   Multiple Pain Sites No      Capillary Blood Glucose: No results found for this or any previous visit (from the past 24 hour(s)).      Exercise Prescription Changes - 05/18/16 1200    Response to Exercise   Blood Pressure (Admit) 134/70 mmHg   Blood Pressure (Exercise) 130/76 mmHg   Blood Pressure (Exit) 110/60 mmHg   Heart Rate (Admit) 85 bpm   Heart Rate (Exercise) 86 bpm   Heart Rate (Exit) 86 bpm   Oxygen Saturation (Admit) 99 %   Oxygen Saturation (Exercise) 98 %   Oxygen Saturation (Exit) 99 %   Rating of Perceived Exertion (Exercise) 13   Perceived Dyspnea (Exercise) 2   Duration Progress to 45 minutes of aerobic exercise without signs/symptoms of physical distress   Intensity THRR unchanged   Resistance Training   Training Prescription Yes   Weight orange bands   Reps 10-12   Interval Training   Interval Training No   Oxygen   Oxygen Continuous   Liters 6   NuStep   Level 1   Minutes 17   METs 1.4   Arm Ergometer   Level 1   Minutes 17     Goals Met:  Exercise tolerated well No report of cardiac  concerns or symptoms Strength training completed today  Goals Unmet:  Not Applicable  Comments: Service time is from 1030 to 1220    Dr. Rush Farmer is Medical Director for Pulmonary Rehab at [facility].

## 2016-05-18 NOTE — Progress Notes (Signed)
Cardiac Individual Treatment Plan  Patient Details  Name: Olivia [name] MRN: [id number] Date of Birth: 1966/01/20 Referring Provider:  [facility]/ Dr. Gwenette Greet       Pulmonary Rehab Walk Test from 05/11/2016 in [place]   Referring Provider  Dr. Gwenette Greet      Initial Encounter Date:       Pulmonary Rehab Walk Test from 05/11/2016 in [place]   Date  05/11/16   Referring Provider  Dr. Gwenette Greet      Visit Diagnosis: No diagnosis found.  Patient's Home Medications on Admission:  Current outpatient prescriptions:  .  calcium-vitamin D (OSCAL WITH D) 500-200 MG-UNIT tablet, Take 1 tablet by mouth., Disp: , Rfl:  .  cetirizine (ZYRTEC) 10 MG tablet, Take 10 mg by mouth daily., Disp: , Rfl:  .  codeine 30 MG tablet, Take 30 mg by mouth every 6 (six) hours as needed (cough)., Disp: , Rfl:  .  dextromethorphan-guaiFENesin (MUCINEX DM) 30-600 MG 12hr tablet, Take 1 tablet by mouth 2 (two) times daily as needed for cough., Disp: , Rfl:  .  estradiol (ESTRACE) 1 MG tablet, Take 1 mg by mouth daily., Disp: , Rfl:  .  FLUoxetine (PROZAC) 20 MG capsule, Take 40 mg by mouth 2 (two) times daily. , Disp: , Rfl:  .  fluticasone (FLONASE) 50 MCG/ACT nasal spray, Place 2 sprays into the nose daily., Disp: 16 g, Rfl: 2 .  medroxyPROGESTERone (PROVERA) 2.5 MG tablet, Take 2.5 mg by mouth daily., Disp: , Rfl:  .  mycophenolate (CELLCEPT) 500 MG tablet, Take 1,000 mg by mouth 2 (two) times daily., Disp: , Rfl:  .  pantoprazole (PROTONIX) 20 MG tablet, Take 20 mg by mouth 2 (two) times daily., Disp: , Rfl:  .  phenylephrine (SUDAFED PE) 10 MG TABS tablet, Take 10 mg by mouth daily., Disp: , Rfl:  .  predniSONE (DELTASONE) 20 MG tablet, Take 20 mg by mouth daily with breakfast., Disp: , Rfl:  .  rizatriptan (MAXALT) 10 MG tablet, Take 10 mg by mouth every 2 (two) hours as needed. May repeat in 2 hours if needed for migraines., Disp: , Rfl:  .   sulfamethoxazole-trimethoprim (BACTRIM DS,SEPTRA DS) 800-160 MG tablet, Take 1 tablet by mouth 3 (three) times a week., Disp: , Rfl:   Past Medical History: Past Medical History  Diagnosis Date  . Migraine headache   . Hyperlipidemia   . Asthma   . Colon polyps   . GERD (gastroesophageal reflux disease)   . IBS (irritable bowel syndrome)   . MVP (mitral valve prolapse)   . Cirrhosis ([place])   . Adenomatous colon polyp   . Iron deficiency anemia   . Anxiety and depression   . Spontaneous pneumothorax   . Pulmonary fibrosis, unspecified ([place])   . NSIP (nonspecific interstitial pneumonia) ([place])     Tobacco Use: History  Smoking status  . Former Smoker -- 5.00 packs/day for 20 years  . Types: Cigarettes  . Quit date: 11/06/1998  Smokeless tobacco  . Never Used    Labs: Recent Review Flowsheet Data    Labs for ITP Cardiac and Pulmonary Rehab Latest Ref Rng 02/01/2010   Cholestrol 0-200 mg/dL 205(H)   LDLDIRECT - 139.1   HDL >39.00 mg/dL 63.10   Trlycerides 0.0-149.0 mg/dL 107.0      Capillary Blood Glucose: No results found for: GLUCAP   Exercise Target Goals:    Exercise Program Goal: Individual  exercise prescription set with THRR, safety & activity barriers. Participant demonstrates ability to understand and report RPE using BORG scale, to self-measure pulse accurately, and to acknowledge the importance of the exercise prescription.  Exercise Prescription Goal: Starting with aerobic activity 30 plus minutes a day, 3 days per week for initial exercise prescription. Provide home exercise prescription and guidelines that participant acknowledges understanding prior to discharge.  Activity Barriers & Risk Stratification:     Activity Barriers & Cardiac Risk Stratification - 05/08/16 1123    Activity Barriers & Cardiac Risk Stratification   Activity Barriers Deconditioning;Shortness of Breath;Joint Problems      6 Minute Walk:     6 Minute Walk      05/11/16  1638       6 Minute Walk   Phase Initial     Distance 1005 feet     Walk Time 6 minutes     # of Rest Breaks 0     MPH 1.9     METS 2.45     RPE 11     Perceived Dyspnea  1     Symptoms No     Resting HR 103 bpm     Resting BP 122/70 mmHg     Max Ex. HR 119 bpm     Max Ex. BP 110/70 mmHg     Interval HR   Baseline HR 103     1 Minute HR 112     2 Minute HR 119     3 Minute HR 118     4 Minute HR 115     5 Minute HR 116     6 Minute HR 101     2 Minute Post HR 96     Interval Heart Rate? Yes     Interval Oxygen   Interval Oxygen? Yes     Baseline Oxygen Saturation % 103 %     Baseline Liters of Oxygen 3 L     1 Minute Oxygen Saturation % 91 %     1 Minute Liters of Oxygen 3 L     2 Minute Oxygen Saturation % 86 %     2 Minute Liters of Oxygen 3 L     3 Minute Oxygen Saturation % 90 %     3 Minute Liters of Oxygen 4 L     4 Minute Oxygen Saturation % 88 %     4 Minute Liters of Oxygen 6 L     5 Minute Oxygen Saturation % 90 %     5 Minute Liters of Oxygen 6 L     6 Minute Oxygen Saturation % 98 %     6 Minute Liters of Oxygen 6 L     2 Minute Post Oxygen Saturation % 98 %     2 Minute Post Liters of Oxygen 6 L        Initial Exercise Prescription:     Initial Exercise Prescription - 05/11/16 1600    Date of Initial Exercise RX and Referring Provider   Date 05/11/16   Referring Provider Dr. Gwenette Greet   NuStep   Level 1   Minutes 17   METs 1.7   Arm Ergometer   Level 1   Minutes 17   Track   Laps 5   Minutes 17   Prescription Details   Frequency (times per week) 2   Duration Progress to 45 minutes of aerobic exercise without signs/symptoms of physical distress   Intensity  THRR 40-80% of Max Heartrate 68-135   Perceived Dyspnea 0-4   Progression   Progression Continue progressive overload as per policy without signs/symptoms or physical distress.   Resistance Training   Training Prescription Yes   Weight orange bands   Reps 10-12      Perform  Capillary Blood Glucose checks as needed.  Exercise Prescription Changes:   Exercise Comments:     Exercise Comments      05/18/16 0752           Exercise Comments Olivia [name] is scheduled to begin her exercise sessions today, 05/18/16          Discharge Exercise Prescription (Final Exercise Prescription Changes):   Nutrition:  Target Goals: Understanding of nutrition guidelines, daily intake of sodium 1500mg , cholesterol 200mg , calories 30% from fat and 7% or less from saturated fats, daily to have 5 or more servings of fruits and vegetables.  Biometrics:     Pre Biometrics - 05/08/16 1156    Pre Biometrics   Grip Strength 30 kg       Nutrition Therapy Plan and Nutrition Goals:   Nutrition Discharge: Nutrition Scores:   Nutrition Goals Re-Evaluation:   Psychosocial: Target Goals: Acknowledge presence or absence of depression, maximize coping skills, provide positive support system. Participant is able to verbalize types and ability to use techniques and skills needed for reducing stress and depression.  Initial Review & Psychosocial Screening:     Initial Psych Review & Screening - 05/08/16 [place]? Yes   Barriers   Psychosocial barriers to participate in program There are no identifiable barriers or psychosocial needs.   Screening Interventions   Interventions Encouraged to exercise      Quality of Life Scores:     Quality of Life - 05/17/16 1550    Quality of Life Scores   Health/Function Pre 12.5 %   Socioeconomic Pre 22.25 %   Psych/Spiritual Pre 17.14 %   Family Pre 19.75 %   GLOBAL Pre 16.6 %      PHQ-9:     Recent Review Flowsheet Data    Depression screen PHQ 2/9 05/08/2016   Decreased Interest 0   Down, Depressed, Hopeless 0   PHQ - 2 Score 0      Psychosocial Evaluation and Intervention:   Psychosocial Re-Evaluation:   Vocational Rehabilitation: Provide vocational rehab assistance to  qualifying candidates.   Vocational Rehab Evaluation & Intervention:   Education: Education Goals: Education classes will be provided on a weekly basis, covering required topics. Participant will state understanding/return demonstration of topics presented.  Learning Barriers/Preferences:     Learning Barriers/Preferences - 05/08/16 1124    Learning Barriers/Preferences   Learning Barriers None   Learning Preferences Written Material;Individual Instruction;Group Instruction      Education Topics: Count Your Pulse:  -Group instruction provided by verbal instruction, demonstration, patient participation and written materials to support subject.  Instructors address importance of being able to find your pulse and how to count your pulse when at home without a heart monitor.  Patients get hands on experience counting their pulse with staff help and individually.   Heart Attack, Angina, and Risk Factor Modification:  -Group instruction provided by verbal instruction, video, and written materials to support subject.  Instructors address signs and symptoms of angina and heart attacks.    Also discuss risk factors for heart disease and how to make changes to improve heart health risk factors.  Functional Fitness:  -Group instruction provided by verbal instruction, demonstration, patient participation, and written materials to support subject.  Instructors address safety measures for doing things around the house.  Discuss how to get up and down off the floor, how to pick things up properly, how to safely get out of a chair without assistance, and balance training.   Meditation and Mindfulness:  -Group instruction provided by verbal instruction, patient participation, and written materials to support subject.  Instructor addresses importance of mindfulness and meditation practice to help reduce stress and improve awareness.  Instructor also leads participants through a meditation exercise.     Stretching for Flexibility and Mobility:  -Group instruction provided by verbal instruction, patient participation, and written materials to support subject.  Instructors lead participants through series of stretches that are designed to increase flexibility thus improving mobility.  These stretches are additional exercise for major muscle groups that are typically performed during regular warm up and cool down.   Hands Only CPR Anytime:  -Group instruction provided by verbal instruction, video, patient participation and written materials to support subject.  Instructors co-teach with AHA video for hands only CPR.  Participants get hands on experience with mannequins.   Nutrition I class: Heart Healthy Eating:  -Group instruction provided by PowerPoint slides, verbal discussion, and written materials to support subject matter. The instructor gives an explanation and review of the Therapeutic Lifestyle Changes diet recommendations, which includes a discussion on lipid goals, dietary fat, sodium, fiber, plant stanol/sterol esters, sugar, and the components of a well-balanced, healthy diet.   Nutrition II class: Lifestyle Skills:  -Group instruction provided by PowerPoint slides, verbal discussion, and written materials to support subject matter. The instructor gives an explanation and review of label reading, grocery shopping for heart health, heart healthy recipe modifications, and ways to make healthier choices when eating out.   Diabetes Question & Answer:  -Group instruction provided by PowerPoint slides, verbal discussion, and written materials to support subject matter. The instructor gives an explanation and review of diabetes co-morbidities, pre- and post-prandial blood glucose goals, pre-exercise blood glucose goals, signs, symptoms, and treatment of hypoglycemia and hyperglycemia, and foot care basics.   Diabetes Blitz:  -Group instruction provided by PowerPoint slides, verbal  discussion, and written materials to support subject matter. The instructor gives an explanation and review of the physiology behind type 1 and type 2 diabetes, diabetes medications and rational behind using different medications, pre- and post-prandial blood glucose recommendations and Hemoglobin A1c goals, diabetes diet, and exercise including blood glucose guidelines for exercising safely.    Portion Distortion:  -Group instruction provided by PowerPoint slides, verbal discussion, written materials, and food models to support subject matter. The instructor gives an explanation of serving size versus portion size, changes in portions sizes over the last 20 years, and what consists of a serving from each food group.   Stress Management:  -Group instruction provided by verbal instruction, video, and written materials to support subject matter.  Instructors review role of stress in heart disease and how to cope with stress positively.     Exercising on Your Own:  -Group instruction provided by verbal instruction, power point, and written materials to support subject.  Instructors discuss benefits of exercise, components of exercise, frequency and intensity of exercise, and end points for exercise.  Also discuss use of nitroglycerin and activating EMS.  Review options of places to exercise outside of rehab.  Review guidelines for sex with heart disease.   Cardiac Drugs  I:  -Group instruction provided by verbal instruction and written materials to support subject.  Instructor reviews cardiac drug classes: antiplatelets, anticoagulants, beta blockers, and statins.  Instructor discusses reasons, side effects, and lifestyle considerations for each drug class.   Cardiac Drugs II:  -Group instruction provided by verbal instruction and written materials to support subject.  Instructor reviews cardiac drug classes: angiotensin converting enzyme inhibitors (ACE-I), angiotensin II receptor blockers (ARBs),  nitrates, and calcium channel blockers.  Instructor discusses reasons, side effects, and lifestyle considerations for each drug class.   Anatomy and Physiology of the Circulatory System:  -Group instruction provided by verbal instruction, video, and written materials to support subject.  Reviews functional anatomy of heart, how it relates to various diagnoses, and what role the heart plays in the overall system.   Knowledge Questionnaire Score:     Knowledge Questionnaire Score - 05/17/16 1549    Knowledge Questionnaire Score   Pre Score 11/13      Core Components/Risk Factors/Patient Goals at Admission:     Personal Goals and Risk Factors at Admission - 05/08/16 1116    Core Components/Risk Factors/Patient Goals on Admission    Weight Management Yes;Weight Loss   Intervention Weight Management: Develop a combined nutrition and exercise program designed to reach desired caloric intake, while maintaining appropriate intake of nutrient and fiber, sodium and fats, and appropriate energy expenditure required for the weight goal.   Admit Weight 155 lb 3.3 oz (70.4 kg)   Increase Strength and Stamina Yes   Intervention Provide advice, education, support and counseling about physical activity/exercise needs.;Develop an individualized exercise prescription for aerobic and resistive training based on initial evaluation findings, risk stratification, comorbidities and participant's personal goals.   Expected Outcomes Achievement of increased cardiorespiratory fitness and enhanced flexibility, muscular endurance and strength shown through measurements of functional capacity and personal statement of participant.   Improve shortness of breath with ADL's Yes   Intervention Provide education, individualized exercise plan and daily activity instruction to help decrease symptoms of SOB with activities of daily living.   Expected Outcomes Short Term: Achieves a reduction of symptoms when performing  activities of daily living.   Develop more efficient breathing techniques such as purse lipped breathing and diaphragmatic breathing; and practicing self-pacing with activity Yes   Intervention Provide education, demonstration and support about specific breathing techniuqes utilized for more efficient breathing. Include techniques such as pursed lipped breathing, diaphragmatic breathing and self-pacing activity.   Expected Outcomes Short Term: Participant will be able to demonstrate and use breathing techniques as needed throughout daily activities.   Increase knowledge of respiratory medications and ability to use respiratory devices properly  Yes   Intervention Provide education and demonstration as needed of appropriate use of medications, inhalers, and oxygen therapy.   Expected Outcomes Short Term: Achieves understanding of medications use. Understands that oxygen is a medication prescribed by physician. Demonstrates appropriate use of inhaler and oxygen therapy.      Core Components/Risk Factors/Patient Goals Review:    Core Components/Risk Factors/Patient Goals at Discharge (Final Review):    ITP Comments:   Comments:Samanthamarie has not officially began her bi-weekly exercise program. Her first day is scheduled for today. Will review progress and tolerance to exercise over the next 30 days.

## 2016-05-23 ENCOUNTER — Encounter (HOSPITAL_COMMUNITY)
Admission: RE | Admit: 2016-05-23 | Discharge: 2016-05-23 | Disposition: A | Discharge status: Home / Self Care | Payer: No Typology Code available for payment source | Source: Ambulatory Visit | Attending: Pulmonary Disease | Admitting: Pulmonary Disease

## 2016-05-23 VITALS — Wt 154.5 lb

## 2016-05-23 DIAGNOSIS — J8410 Pulmonary fibrosis, unspecified: Secondary | ICD-10-CM | POA: Diagnosis not present

## 2016-05-23 NOTE — Progress Notes (Signed)
Daily Session Note  Patient Details  Name: Olivia [name] MRN: [id number] Date of Birth: 1966/03/13 Referring Provider:        Pulmonary Rehab Walk Test from 05/11/2016 in [place]   Referring Provider  Dr. Gwenette Greet      Encounter Date: 05/23/2016  Check In:     Session Check In - 05/23/16 1128    Check-In   Location [facility]   Staff Present Rosebud Poles, RN, BSN;Lisa Ysidro Evert, Felipe Drone, RN, MHA;Portia Rollene Rotunda, RN, BSN;Treg Diemer, MS, ACSM RCEP, Exercise Physiologist   Supervising physician immediately available to respond to emergencies [facility] immediately available   Physician(s) Dr. Marily Memos   Medication changes reported     No   Fall or balance concerns reported    No   Warm-up and Cool-down Performed as group-led instruction   Resistance Training Performed Yes   VAD Patient? No   Pain Assessment   Currently in Pain? No/denies   Multiple Pain Sites No      Capillary Blood Glucose: No results found for this or any previous visit (from the past 24 hour(s)).      Exercise Prescription Changes - 05/23/16 1200    Response to Exercise   Blood Pressure (Admit) 118/70 mmHg   Blood Pressure (Exercise) 132/80 mmHg   Blood Pressure (Exit) 104/60 mmHg   Heart Rate (Admit) 98 bpm   Heart Rate (Exercise) 118 bpm   Heart Rate (Exit) 83 bpm   Oxygen Saturation (Admit) 90 %   Oxygen Saturation (Exercise) 91 %   Oxygen Saturation (Exit) 98 %   Rating of Perceived Exertion (Exercise) 11   Perceived Dyspnea (Exercise) 1   Duration Progress to 45 minutes of aerobic exercise without signs/symptoms of physical distress   Intensity THRR unchanged   Progression   Progression Continue to progress workloads to maintain intensity without signs/symptoms of physical distress.   Resistance Training   Training Prescription Yes   Weight orange bands   Reps 10-12   Interval Training   Interval Training No   Oxygen   Oxygen Continuous   Liters 6   NuStep   Level 1   Minutes 17   METs 1.7   Arm Ergometer   Level 1   Minutes 17   Track   Laps 12   Minutes 17     Goals Met:  Exercise tolerated well No report of cardiac concerns or symptoms Strength training completed today  Goals Unmet:  Not Applicable  Comments: Service time is from 10:30am to 12:10pm    Dr. Rush Farmer is Medical Director for Pulmonary Rehab at [facility]

## 2016-05-25 ENCOUNTER — Encounter (HOSPITAL_COMMUNITY)
Admission: RE | Admit: 2016-05-25 | Discharge: 2016-05-25 | Disposition: A | Discharge status: Home / Self Care | Payer: No Typology Code available for payment source | Source: Ambulatory Visit | Attending: Pulmonary Disease | Admitting: Pulmonary Disease

## 2016-05-25 VITALS — Wt 153.9 lb

## 2016-05-25 DIAGNOSIS — J8410 Pulmonary fibrosis, unspecified: Secondary | ICD-10-CM | POA: Diagnosis not present

## 2016-05-25 NOTE — Progress Notes (Signed)
Daily Session Note  Patient Details  Name: Olivia [name] MRN: [id number] Date of Birth: 03-23-1966 Referring Provider:        Pulmonary Rehab Walk Test from 05/11/2016 in [place]   Referring Provider  Dr. Gwenette Greet      Encounter Date: 05/25/2016  Check In:     Session Check In - 05/25/16 1029    Check-In   [facility]   Staff Present Su Hilt, MS, ACSM RCEP, Exercise Physiologist;Joan Leonia Reeves, RN, Luisa Hart, RN, BSN   Supervising physician immediately available to respond to emergencies [facility] immediately available   Physician(s) Dr. Cruzita Lederer   Medication changes reported     No   Fall or balance concerns reported    No   Warm-up and Cool-down Performed as group-led instruction   Resistance Training Performed Yes   VAD Patient? No   Pain Assessment   Currently in Pain? No/denies   Multiple Pain Sites No      Capillary Blood Glucose: No results found for this or any previous visit (from the past 24 hour(s)).      Exercise Prescription Changes - 05/25/16 1300    Exercise Review   Progression Yes   Response to Exercise   Blood Pressure (Admit) 116/70 mmHg   Blood Pressure (Exercise) 120/66 mmHg   Blood Pressure (Exit) 100/64 mmHg   Heart Rate (Admit) 75 bpm   Heart Rate (Exercise) 113 bpm   Heart Rate (Exit) 83 bpm   Oxygen Saturation (Admit) 95 %   Oxygen Saturation (Exercise) 91 %   Oxygen Saturation (Exit) 97 %   Rating of Perceived Exertion (Exercise) 11   Perceived Dyspnea (Exercise) 1   Duration Progress to 45 minutes of aerobic exercise without signs/symptoms of physical distress   Intensity THRR unchanged   Progression   Progression Continue to progress workloads to maintain intensity without signs/symptoms of physical distress.   Resistance Training   Training Prescription Yes   Weight orange bands   Reps 10-12  10 minutes   Interval Training   Interval Training No   Oxygen   Oxygen Continuous   Liters 6   NuStep   Level 2   Minutes 17   METs 1.8   Track   Laps 14   Minutes 17     Goals Met:  Exercise tolerated well No report of cardiac concerns or symptoms Strength training completed today  Goals Unmet:  Exercise tolerated well today  Comments: Service time is from 10:30am to 12:30pm    Dr. Rush Farmer is Medical Director for Pulmonary Rehab at [facility].

## 2016-05-30 ENCOUNTER — Encounter (HOSPITAL_COMMUNITY): Payer: No Typology Code available for payment source

## 2016-06-01 ENCOUNTER — Encounter (HOSPITAL_COMMUNITY): Payer: No Typology Code available for payment source

## 2016-06-06 ENCOUNTER — Encounter (HOSPITAL_COMMUNITY)
Admission: RE | Admit: 2016-06-06 | Discharge: 2016-06-06 | Disposition: A | Discharge status: Home / Self Care | Payer: No Typology Code available for payment source | Source: Ambulatory Visit | Attending: Pulmonary Disease | Admitting: Pulmonary Disease

## 2016-06-06 VITALS — Wt 153.9 lb

## 2016-06-06 DIAGNOSIS — F419 Anxiety disorder, unspecified: Secondary | ICD-10-CM | POA: Insufficient documentation

## 2016-06-06 DIAGNOSIS — J45909 Unspecified asthma, uncomplicated: Secondary | ICD-10-CM | POA: Insufficient documentation

## 2016-06-06 DIAGNOSIS — J8410 Pulmonary fibrosis, unspecified: Secondary | ICD-10-CM | POA: Insufficient documentation

## 2016-06-06 DIAGNOSIS — F329 Major depressive disorder, single episode, unspecified: Secondary | ICD-10-CM | POA: Diagnosis not present

## 2016-06-06 DIAGNOSIS — Z79899 Other long term (current) drug therapy: Secondary | ICD-10-CM | POA: Insufficient documentation

## 2016-06-06 DIAGNOSIS — K219 Gastro-esophageal reflux disease without esophagitis: Secondary | ICD-10-CM | POA: Insufficient documentation

## 2016-06-06 DIAGNOSIS — Z7951 Long term (current) use of inhaled steroids: Secondary | ICD-10-CM | POA: Insufficient documentation

## 2016-06-06 DIAGNOSIS — K7460 Unspecified cirrhosis of liver: Secondary | ICD-10-CM | POA: Diagnosis not present

## 2016-06-06 DIAGNOSIS — E785 Hyperlipidemia, unspecified: Secondary | ICD-10-CM | POA: Diagnosis not present

## 2016-06-06 DIAGNOSIS — Z87891 Personal history of nicotine dependence: Secondary | ICD-10-CM | POA: Insufficient documentation

## 2016-06-06 DIAGNOSIS — Z7952 Long term (current) use of systemic steroids: Secondary | ICD-10-CM | POA: Diagnosis not present

## 2016-06-06 NOTE — Progress Notes (Signed)
Daily Session Note  Patient Details  Name: Olivia [name] MRN: [id number] Date of Birth: December 11, 1965 Referring Provider:   April Manson Pulmonary Rehab Walk Test from 05/11/2016 in [place]  Referring Provider  Dr. Gwenette Greet      Encounter Date: 06/06/2016  Check In:     Session Check In - 06/06/16 1037      Check-In   Location [facility]   Staff Present Su Hilt, MS, ACSM RCEP, Exercise Physiologist;Joan Leonia Reeves, RN, Luisa Hart, RN, Roque Cash, RN   Supervising physician immediately available to respond to emergencies [facility] immediately available   Physician(s) Dr. Marily Memos   Medication changes reported     No   Fall or balance concerns reported    No   Warm-up and Cool-down Performed as group-led instruction   Resistance Training Performed Yes   VAD Patient? No      Capillary Blood Glucose: No results found for this or any previous visit (from the past 24 hour(s)).      Exercise Prescription Changes - 06/06/16 1200      Response to Exercise   Blood Pressure (Admit) 104/70   Blood Pressure (Exercise) 120/70   Blood Pressure (Exit) 100/60   Heart Rate (Admit) 104 bpm   Heart Rate (Exercise) 108 bpm   Heart Rate (Exit) 81 bpm   Oxygen Saturation (Admit) 89 %   Oxygen Saturation (Exercise) 94 %   Oxygen Saturation (Exit) 98 %   Rating of Perceived Exertion (Exercise) 13   Perceived Dyspnea (Exercise) 2   Duration Progress to 45 minutes of aerobic exercise without signs/symptoms of physical distress   Intensity THRR unchanged     Progression   Progression Continue to progress workloads to maintain intensity without signs/symptoms of physical distress.     Resistance Training   Training Prescription Yes   Weight orange bands   Reps 10-12  10 minutes     Interval Training   Interval Training No     Oxygen   Oxygen Continuous   Liters 6     NuStep   Level 2   Minutes 17   METs 2.8      Arm Ergometer   Level 1   Minutes 17     Track   Laps 14   Minutes 17     Goals Met:  Exercise tolerated well No report of cardiac concerns or symptoms Strength training completed today  Goals Unmet:  Not Applicable  Comments: Service time is from 10:30am to 12:00pm    Dr. Rush Farmer is Medical Director for Pulmonary Rehab at [facility].

## 2016-06-08 ENCOUNTER — Encounter (HOSPITAL_COMMUNITY): Payer: No Typology Code available for payment source

## 2016-06-13 ENCOUNTER — Encounter (HOSPITAL_COMMUNITY)
Admission: RE | Admit: 2016-06-13 | Discharge: 2016-06-13 | Disposition: A | Discharge status: Home / Self Care | Payer: No Typology Code available for payment source | Source: Ambulatory Visit | Attending: Pulmonary Disease | Admitting: Pulmonary Disease

## 2016-06-13 VITALS — Wt 157.8 lb

## 2016-06-13 DIAGNOSIS — J8410 Pulmonary fibrosis, unspecified: Secondary | ICD-10-CM

## 2016-06-13 NOTE — Progress Notes (Signed)
Daily Session Note  Patient Details  Name: Olivia [name] MRN: [id number] Date of Birth: 11/05/1966 Referring Provider:   April Manson [facility] Rehab Walk Test from 05/11/2016 in [place]  Referring Provider  Dr. Gwenette Greet      Encounter Date: 06/13/2016  Check In:     Session Check In - 06/13/16 1100      Check-In   Location [facility]   Staff Present Su Hilt, MS, ACSM RCEP, Exercise Physiologist;Joan Woodhull, RN, BSN;Yajahira Tison Ysidro Evert, Felipe Drone, RN, [place] physician immediately available to respond to emergencies [facility] immediately available   Physician(s) Dr. Marily Memos    Medication changes reported     No   Fall or balance concerns reported    No   Warm-up and Cool-down Performed as group-led instruction   Resistance Training Performed Yes   VAD Patient? No     Pain Assessment   Currently in Pain? No/denies      Capillary Blood Glucose: No results found for this or any previous visit (from the past 24 hour(s)).      Exercise Prescription Changes - 06/13/16 1200      Response to Exercise   Blood Pressure (Admit) 110/64   Blood Pressure (Exercise) 104/60   Blood Pressure (Exit) 114/60   Heart Rate (Admit) 92 bpm   Heart Rate (Exercise) 98 bpm   Heart Rate (Exit) 80 bpm   Oxygen Saturation (Admit) 97 %   Oxygen Saturation (Exercise) 95 %   Oxygen Saturation (Exit) 100 %   Rating of Perceived Exertion (Exercise) 13   Perceived Dyspnea (Exercise) 3   Duration Progress to 45 minutes of aerobic exercise without signs/symptoms of physical distress   Intensity THRR unchanged     Progression   Progression Continue to progress workloads to maintain intensity without signs/symptoms of physical distress.     Resistance Training   Training Prescription Yes   Weight orange bands   Reps 10-12  10 minutes of strength training     Interval Training   Interval Training No     Oxygen   Oxygen Continuous   Liters 6     NuStep   Level 2   Minutes 17   METs 1.7     Arm Ergometer   Level 1   Minutes 17     Track   Laps 14   Minutes 17     Goals Met:  Exercise tolerated well No report of cardiac concerns or symptoms Strength training completed today  Goals Unmet:  Not Applicable  Comments: Service time is from 1030 to 1200    Dr. Rush Farmer is Medical Director for Pulmonary Rehab at [facility].

## 2016-06-15 ENCOUNTER — Encounter (HOSPITAL_COMMUNITY): Payer: No Typology Code available for payment source

## 2016-06-20 ENCOUNTER — Encounter (HOSPITAL_COMMUNITY)
Admission: RE | Admit: 2016-06-20 | Discharge: 2016-06-20 | Disposition: A | Discharge status: Home / Self Care | Payer: No Typology Code available for payment source | Source: Ambulatory Visit | Attending: Pulmonary Disease | Admitting: Pulmonary Disease

## 2016-06-20 VITALS — Wt 156.1 lb

## 2016-06-20 DIAGNOSIS — J8410 Pulmonary fibrosis, unspecified: Secondary | ICD-10-CM

## 2016-06-20 NOTE — Progress Notes (Signed)
Daily Session Note  Patient Details  Name: Olivia [name] MRN: [id number] Date of Birth: 01-Nov-1966 Referring Provider:   April Manson Pulmonary Rehab Walk Test from 05/11/2016 in [place]  Referring Provider  Dr. Gwenette Greet      Encounter Date: 06/20/2016  Check In:     Session Check In - 06/20/16 1030      Check-In   Location [facility]   Staff Present Rosebud Poles, RN, BSN;Molly diVincenzo, MS, ACSM RCEP, Exercise Physiologist;Michaell Grider Ysidro Evert, Felipe Drone, RN, MHA;Portia Rollene Rotunda, RN, BSN   Supervising physician immediately available to respond to emergencies [facility] immediately available   Physician(s) Dr, Marily Memos   Medication changes reported     No   Fall or balance concerns reported    No   Warm-up and Cool-down Performed as group-led instruction   Resistance Training Performed Yes   VAD Patient? No     Pain Assessment   Currently in Pain? No/denies   Multiple Pain Sites No      Capillary Blood Glucose: No results found for this or any previous visit (from the past 24 hour(s)).      Exercise Prescription Changes - 06/20/16 1200      Exercise Review   Progression Yes     Response to Exercise   Blood Pressure (Admit) 98/60   Blood Pressure (Exercise) 118/64   Blood Pressure (Exit) 96/60   Heart Rate (Admit) 91 bpm   Heart Rate (Exercise) 110 bpm   Heart Rate (Exit) 90 bpm   Oxygen Saturation (Admit) 96 %   Oxygen Saturation (Exercise) 93 %   Oxygen Saturation (Exit) 100 %   Rating of Perceived Exertion (Exercise) 13   Perceived Dyspnea (Exercise) 2   Duration Progress to 45 minutes of aerobic exercise without signs/symptoms of physical distress   Intensity THRR unchanged     Progression   Progression Continue to progress workloads to maintain intensity without signs/symptoms of physical distress.     Resistance Training   Training Prescription Yes   Weight orange bands   Reps 10-12  10  minutes of strength training     Interval Training   Interval Training No     Oxygen   Oxygen Continuous   Liters 6     Goals Met:  Exercise tolerated well No report of cardiac concerns or symptoms Strength training completed today  Goals Unmet:  Not Applicable  Comments: Service time is from 1030 to 1210    Dr. Rush Farmer is Medical Director for Pulmonary Rehab at [facility].

## 2016-06-20 NOTE — Progress Notes (Signed)
Pulmonary Individual Treatment Plan  Patient Details  Name: Olivia [name] MRN: [id number] Date of Birth: May 02, 1966 Referring Provider:   April Manson Pulmonary Rehab Walk Test from 05/11/2016 in [place]  Referring Provider  Dr. Gwenette Greet      Initial Encounter Date:  Flowsheet Row Pulmonary Rehab Walk Test from 05/11/2016 in [place]  Date  05/11/16  Referring Provider  Dr. Gwenette Greet      Visit Diagnosis: Pulmonary fibrosis, unspecified ([place])  Patient's Home Medications on Admission:   Current Outpatient Prescriptions:  .  calcium-vitamin D (OSCAL WITH D) 500-200 MG-UNIT tablet, Take 1 tablet by mouth., Disp: , Rfl:  .  cetirizine (ZYRTEC) 10 MG tablet, Take 10 mg by mouth daily., Disp: , Rfl:  .  codeine 30 MG tablet, Take 30 mg by mouth every 6 (six) hours as needed (cough)., Disp: , Rfl:  .  dextromethorphan-guaiFENesin (MUCINEX DM) 30-600 MG 12hr tablet, Take 1 tablet by mouth 2 (two) times daily as needed for cough., Disp: , Rfl:  .  estradiol (ESTRACE) 1 MG tablet, Take 1 mg by mouth daily., Disp: , Rfl:  .  FLUoxetine (PROZAC) 20 MG capsule, Take 40 mg by mouth 2 (two) times daily. , Disp: , Rfl:  .  fluticasone (FLONASE) 50 MCG/ACT nasal spray, Place 2 sprays into the nose daily., Disp: 16 g, Rfl: 2 .  medroxyPROGESTERone (PROVERA) 2.5 MG tablet, Take 2.5 mg by mouth daily., Disp: , Rfl:  .  mycophenolate (CELLCEPT) 500 MG tablet, Take 1,000 mg by mouth 2 (two) times daily., Disp: , Rfl:  .  pantoprazole (PROTONIX) 20 MG tablet, Take 20 mg by mouth 2 (two) times daily., Disp: , Rfl:  .  phenylephrine (SUDAFED PE) 10 MG TABS tablet, Take 10 mg by mouth daily., Disp: , Rfl:  .  predniSONE (DELTASONE) 20 MG tablet, Take 20 mg by mouth daily with breakfast., Disp: , Rfl:  .  rizatriptan (MAXALT) 10 MG tablet, Take 10 mg by mouth every 2 (two) hours as needed. May repeat in 2 hours if needed for migraines., Disp: , Rfl:   .  sulfamethoxazole-trimethoprim (BACTRIM DS,SEPTRA DS) 800-160 MG tablet, Take 1 tablet by mouth 3 (three) times a week., Disp: , Rfl:   Past Medical History: Past Medical History:  Diagnosis Date  . Adenomatous colon polyp   . Anxiety and depression   . Asthma   . Cirrhosis ([place])   . Colon polyps   . GERD (gastroesophageal reflux disease)   . Hyperlipidemia   . IBS (irritable bowel syndrome)   . Iron deficiency anemia   . Migraine headache   . MVP (mitral valve prolapse)   . NSIP (nonspecific interstitial pneumonia) ([place])   . Pulmonary fibrosis, unspecified ([place])   . Spontaneous pneumothorax     Tobacco Use: History  Smoking Status  . Former Smoker  . Packs/day: 5.00  . Years: 20.00  . Types: Cigarettes  . Quit date: 11/06/1998  Smokeless Tobacco  . Never Used    Labs: Recent Review Flowsheet Data    Labs for ITP Cardiac and Pulmonary Rehab Latest Ref Rng & Units 02/01/2010   Cholestrol 0 - 200 mg/dL 205(H)   LDLDIRECT mg/dL 139.1   HDL >39.00 mg/dL 63.10   Trlycerides 0.0 - 149.0 mg/dL 107.0      Capillary Blood Glucose: No results found for: GLUCAP   ADL UCSD:     Pulmonary Assessment Scores    Row  Name 05/17/16 1549         ADL UCSD   ADL Phase Entry     SOB Score total 67        Pulmonary Function Assessment:   Exercise Target Goals:    Exercise Program Goal: Individual exercise prescription set with THRR, safety & activity barriers. Participant demonstrates ability to understand and report RPE using BORG scale, to self-measure pulse accurately, and to acknowledge the importance of the exercise prescription.  Exercise Prescription Goal: Starting with aerobic activity 30 plus minutes a day, 3 days per week for initial exercise prescription. Provide home exercise prescription and guidelines that participant acknowledges understanding prior to discharge.  Activity Barriers & Risk Stratification:     Activity Barriers & Cardiac Risk  Stratification - 05/08/16 1123      Activity Barriers & Cardiac Risk Stratification   Activity Barriers Deconditioning;Shortness of Breath;Joint Problems      6 Minute Walk:     6 Minute Walk    Row Name 05/11/16 1638         6 Minute Walk   Phase Initial     Distance 1005 feet     Walk Time 6 minutes     # of Rest Breaks 0     MPH 1.9     METS 2.45     RPE 11     Perceived Dyspnea  1     Symptoms No     Resting HR 103 bpm     Resting BP 122/70     Max Ex. HR 119 bpm     Max Ex. BP 110/70       Interval HR   Baseline HR 103     1 Minute HR 112     2 Minute HR 119     3 Minute HR 118     4 Minute HR 115     5 Minute HR 116     6 Minute HR 101     2 Minute Post HR 96     Interval Heart Rate? Yes       Interval Oxygen   Interval Oxygen? Yes     Baseline Oxygen Saturation % 103 %     Baseline Liters of Oxygen 3 L     1 Minute Oxygen Saturation % 91 %     1 Minute Liters of Oxygen 3 L     2 Minute Oxygen Saturation % 86 %     2 Minute Liters of Oxygen 3 L     3 Minute Oxygen Saturation % 90 %     3 Minute Liters of Oxygen 4 L     4 Minute Oxygen Saturation % 88 %     4 Minute Liters of Oxygen 6 L     5 Minute Oxygen Saturation % 90 %     5 Minute Liters of Oxygen 6 L     6 Minute Oxygen Saturation % 98 %     6 Minute Liters of Oxygen 6 L     2 Minute Post Oxygen Saturation % 98 %     2 Minute Post Liters of Oxygen 6 L        Initial Exercise Prescription:     Initial Exercise Prescription - 05/11/16 1600      Date of Initial Exercise RX and Referring Provider   Date 05/11/16   Referring Provider Dr. Gwenette Greet     NuStep   Level 1  Minutes 17   METs 1.7     Arm Ergometer   Level 1   Minutes 17     Track   Laps 5   Minutes 17     Prescription Details   Frequency (times per week) 2   Duration Progress to 45 minutes of aerobic exercise without signs/symptoms of physical distress     Intensity   THRR 40-80% of Max Heartrate 68-135    Perceived Dyspnea 0-4     Progression   Progression Continue progressive overload as per policy without signs/symptoms or physical distress.     Resistance Training   Training Prescription Yes   Weight orange bands   Reps 10-12      Perform Capillary Blood Glucose checks as needed.  Exercise Prescription Changes:     Exercise Prescription Changes    Row Name 05/18/16 1200 05/23/16 1200 05/25/16 1300 06/06/16 1200 06/13/16 1200     Exercise Review   Progression  -  - Yes  -  -     Response to Exercise   Blood Pressure (Admit) 134/70 118/70 116/70 104/70 110/64   Blood Pressure (Exercise) 130/76 132/80 120/66 120/70 104/60   Blood Pressure (Exit) 110/60 104/60 100/64 100/60 114/60   Heart Rate (Admit) 85 bpm 98 bpm 75 bpm 104 bpm 92 bpm   Heart Rate (Exercise) 86 bpm 118 bpm 113 bpm 108 bpm 98 bpm   Heart Rate (Exit) 86 bpm 83 bpm 83 bpm 81 bpm 80 bpm   Oxygen Saturation (Admit) 99 % 90 % 95 % 89 % 97 %   Oxygen Saturation (Exercise) 98 % 91 % 91 % 94 % 95 %   Oxygen Saturation (Exit) 99 % 98 % 97 % 98 % 100 %   Rating of Perceived Exertion (Exercise) 13 11 11 13 13    Perceived Dyspnea (Exercise) 2 1 1 2 3    Duration Progress to 45 minutes of aerobic exercise without signs/symptoms of physical distress Progress to 45 minutes of aerobic exercise without signs/symptoms of physical distress Progress to 45 minutes of aerobic exercise without signs/symptoms of physical distress Progress to 45 minutes of aerobic exercise without signs/symptoms of physical distress Progress to 45 minutes of aerobic exercise without signs/symptoms of physical distress   Intensity THRR unchanged THRR unchanged THRR unchanged THRR unchanged THRR unchanged     Progression   Progression Continue to progress workloads to maintain intensity without signs/symptoms of physical distress. Continue to progress workloads to maintain intensity without signs/symptoms of physical distress. Continue to progress  workloads to maintain intensity without signs/symptoms of physical distress. Continue to progress workloads to maintain intensity without signs/symptoms of physical distress. Continue to progress workloads to maintain intensity without signs/symptoms of physical distress.     Resistance Training   Training Prescription Yes Yes Yes Yes Yes   Weight orange bands orange bands orange bands orange bands orange bands   Reps 10-12 10-12 10-12  10 minutes 10-12  10 minutes 10-12  10 minutes of strength training     Interval Training   Interval Training No No No No No     Oxygen   Oxygen Continuous Continuous Continuous Continuous Continuous   Liters 6 6 6 6 6      NuStep   Level 1 1 2 2 2    Minutes 17 17 17 17 17    METs 1.4 1.7 1.8 2.8 1.7     Arm Ergometer   Level 1 1  - 1 1   Minutes  17 17  - 17 17     Track   Laps  - 12 14 14 14    Minutes  - 17 17 17 17       Exercise Comments:     Exercise Comments    Row Name 05/18/16 0752 06/20/16 0745         Exercise Comments Shevonne is scheduled to begin her exercise sessions today, 05/18/16 Patient is slowly progressing workload intensities. Has only attended 5 excerise sessions. Will cont. to monitor.          Discharge Exercise Prescription (Final Exercise Prescription Changes):     Exercise Prescription Changes - 06/13/16 1200      Response to Exercise   Blood Pressure (Admit) 110/64   Blood Pressure (Exercise) 104/60   Blood Pressure (Exit) 114/60   Heart Rate (Admit) 92 bpm   Heart Rate (Exercise) 98 bpm   Heart Rate (Exit) 80 bpm   Oxygen Saturation (Admit) 97 %   Oxygen Saturation (Exercise) 95 %   Oxygen Saturation (Exit) 100 %   Rating of Perceived Exertion (Exercise) 13   Perceived Dyspnea (Exercise) 3   Duration Progress to 45 minutes of aerobic exercise without signs/symptoms of physical distress   Intensity THRR unchanged     Progression   Progression Continue to progress workloads to maintain intensity  without signs/symptoms of physical distress.     Resistance Training   Training Prescription Yes   Weight orange bands   Reps 10-12  10 minutes of strength training     Interval Training   Interval Training No     Oxygen   Oxygen Continuous   Liters 6     NuStep   Level 2   Minutes 17   METs 1.7     Arm Ergometer   Level 1   Minutes 17     Track   Laps 14   Minutes 17       Nutrition:  Target Goals: Understanding of nutrition guidelines, daily intake of sodium 1500mg , cholesterol 200mg , calories 30% from fat and 7% or less from saturated fats, daily to have 5 or more servings of fruits and vegetables.  Biometrics:     Pre Biometrics - 05/08/16 1156      Pre Biometrics   Grip Strength 30 kg       Nutrition Therapy Plan and Nutrition Goals:   Nutrition Discharge: Rate Your Plate Scores:   Psychosocial: Target Goals: Acknowledge presence or absence of depression, maximize coping skills, provide positive support system. Participant is able to verbalize types and ability to use techniques and skills needed for reducing stress and depression.  Initial Review & Psychosocial Screening:     Initial Psych Review & Screening - 05/08/16 [place]? Yes     Barriers   Psychosocial barriers to participate in program There are no identifiable barriers or psychosocial needs.     Screening Interventions   Interventions Encouraged to exercise      Quality of Life Scores:     Quality of Life - 05/17/16 1550      Quality of Life Scores   Health/Function Pre 12.5 %   Socioeconomic Pre 22.25 %   Psych/Spiritual Pre 17.14 %   Family Pre 19.75 %   GLOBAL Pre 16.6 %      PHQ-9: Recent Review Flowsheet Data    Depression screen [facility] 2/9 05/08/2016   Decreased Interest 0  Down, Depressed, Hopeless 0   PHQ - 2 Score 0      Psychosocial Evaluation and Intervention:   Psychosocial Re-Evaluation:     Psychosocial  Re-Evaluation    [place] Name 06/19/16 0927             Psychosocial Re-Evaluation   Interventions Encouraged to attend Pulmonary Rehabilitation for the exercise       Comments no psycocial barriers identified during the past 30 days         Education: Education Goals: Education classes will be provided on a weekly basis, covering required topics. Participant will state understanding/return demonstration of topics presented.  Learning Barriers/Preferences:     Learning Barriers/Preferences - 05/08/16 1124      Learning Barriers/Preferences   Learning Barriers None   Learning Preferences Written Material;Individual Instruction;Group Instruction      Education Topics: Risk Factor Reduction:  -Group instruction that is supported by a PowerPoint presentation. Instructor discusses the definition of a risk factor, different risk factors for pulmonary disease, and how the heart and lungs work together.     Nutrition for Pulmonary Patient:  -Group instruction provided by PowerPoint slides, verbal discussion, and written materials to support subject matter. The instructor gives an explanation and review of healthy diet recommendations, which includes a discussion on weight management, recommendations for fruit and vegetable consumption, as well as protein, fluid, caffeine, fiber, sodium, sugar, and alcohol. Tips for eating when patients are short of breath are discussed.   Pursed Lip Breathing:  -Group instruction that is supported by demonstration and informational handouts. Instructor discusses the benefits of pursed lip and diaphragmatic breathing and detailed demonstration on how to preform both.     Oxygen Safety:  -Group instruction provided by PowerPoint, verbal discussion, and written material to support subject matter. There is an overview of "What is Oxygen" and "Why do we need it".  Instructor also reviews how to create a safe environment for oxygen use, the importance of using  oxygen as prescribed, and the risks of noncompliance. There is a brief discussion on traveling with oxygen and resources the patient may utilize.   Oxygen Equipment:  -Group instruction provided by [facility] Staff utilizing handouts, written materials, and equipment demonstrations.   Signs and Symptoms:  -Group instruction provided by written material and verbal discussion to support subject matter. Warning signs and symptoms of infection, stroke, and heart attack are reviewed and when to call the physician/911 reinforced. Tips for preventing the spread of infection discussed. Flowsheet Row PULMONARY REHAB OTHER RESPIRATORY from 05/25/2016 in [place]  Date  05/18/16  Educator  RN  Instruction Review Code  2- meets goals/outcomes      Advanced Directives:  -Group instruction provided by verbal instruction and written material to support subject matter. Instructor reviews Advanced Directive laws and proper instruction for filling out document.   Pulmonary Video:  -Group video education that reviews the importance of medication and oxygen compliance, exercise, good nutrition, pulmonary hygiene, and pursed lip and diaphragmatic breathing for the pulmonary patient.   Exercise for the Pulmonary Patient:  -Group instruction that is supported by a PowerPoint presentation. Instructor discusses benefits of exercise, core components of exercise, frequency, duration, and intensity of an exercise routine, importance of utilizing pulse oximetry during exercise, safety while exercising, and options of places to exercise outside of rehab.     Pulmonary Medications:  -Verbally interactive group education provided by instructor with focus on inhaled medications and proper administration.  Anatomy and Physiology of the Respiratory System and Intimacy:  -Group instruction provided by PowerPoint, verbal discussion, and written material to support subject matter.  Instructor reviews respiratory cycle and anatomical components of the respiratory system and their functions. Instructor also reviews differences in obstructive and restrictive respiratory diseases with examples of each. Intimacy, Sex, and Sexuality differences are reviewed with a discussion on how relationships can change when diagnosed with pulmonary disease. Common sexual concerns are reviewed. Flowsheet Row PULMONARY REHAB OTHER RESPIRATORY from 05/25/2016 in [place]  Date  05/25/16  Educator  RN  Instruction Review Code  2- meets goals/outcomes      Knowledge Questionnaire Score:     Knowledge Questionnaire Score - 05/17/16 1549      Knowledge Questionnaire Score   Pre Score 11/13      Core Components/Risk Factors/Patient Goals at Admission:     Personal Goals and Risk Factors at Admission - 05/08/16 1116      Core Components/Risk Factors/Patient Goals on Admission    Weight Management Yes;Weight Loss   Intervention Weight Management: Develop a combined nutrition and exercise program designed to reach desired caloric intake, while maintaining appropriate intake of nutrient and fiber, sodium and fats, and appropriate energy expenditure required for the weight goal.   Admit Weight 155 lb 3.3 oz (70.4 kg)   Increase Strength and Stamina Yes   Intervention Provide advice, education, support and counseling about physical activity/exercise needs.;Develop an individualized exercise prescription for aerobic and resistive training based on initial evaluation findings, risk stratification, comorbidities and participant's personal goals.   Expected Outcomes Achievement of increased cardiorespiratory fitness and enhanced flexibility, muscular endurance and strength shown through measurements of functional capacity and personal statement of participant.   Improve shortness of breath with ADL's Yes   Intervention Provide education, individualized exercise plan  and daily activity instruction to help decrease symptoms of SOB with activities of daily living.   Expected Outcomes Short Term: Achieves a reduction of symptoms when performing activities of daily living.   Develop more efficient breathing techniques such as purse lipped breathing and diaphragmatic breathing; and practicing self-pacing with activity Yes   Intervention Provide education, demonstration and support about specific breathing techniuqes utilized for more efficient breathing. Include techniques such as pursed lipped breathing, diaphragmatic breathing and self-pacing activity.   Expected Outcomes Short Term: Participant will be able to demonstrate and use breathing techniques as needed throughout daily activities.   Increase knowledge of respiratory medications and ability to use respiratory devices properly  Yes   Intervention Provide education and demonstration as needed of appropriate use of medications, inhalers, and oxygen therapy.   Expected Outcomes Short Term: Achieves understanding of medications use. Understands that oxygen is a medication prescribed by physician. Demonstrates appropriate use of inhaler and oxygen therapy.      Core Components/Risk Factors/Patient Goals Review:      Goals and Risk Factor Review    Row Name 06/19/16 0925             Core Components/Risk Factors/Patient Goals Review   Personal Goals Review Weight Management/Obesity;Increase knowledge of respiratory medications and ability to use respiratory devices properly.;Improve shortness of breath with ADL's;Increase Strength and Stamina;Develop more efficient breathing techniques such as purse lipped breathing and diaphragmatic breathing and practicing self-pacing with activity.       Review see "comments" section on ITP       Expected Outcomes see Admission expected outcomes  Core Components/Risk Factors/Patient Goals at Discharge (Final Review):      Goals and Risk Factor Review -  06/19/16 0925      Core Components/Risk Factors/Patient Goals Review   Personal Goals Review Weight Management/Obesity;Increase knowledge of respiratory medications and ability to use respiratory devices properly.;Improve shortness of breath with ADL's;Increase Strength and Stamina;Develop more efficient breathing techniques such as purse lipped breathing and diaphragmatic breathing and practicing self-pacing with activity.   Review see "comments" section on ITP   Expected Outcomes see Admission expected outcomes      ITP Comments:   Comments: ITP REVIEW Pt is making slow progress toward personal goals after completing 5 sessions. She has had to miss several appointments due to her daughters illness and hers.  Recommend continued exercise, life style modification, education, and utilization of breathing techniques to increase stamina and strength and decrease shortness of breath with exertion.

## 2016-06-20 NOTE — Progress Notes (Signed)
I have reviewed a Home Exercise Prescription with Olivia [name] . Olivia [name] is not currently exercising at home.  The patient was advised to walk 2-3 days a week for 30 minutes.  Olivia [name] and I discussed how to progress their exercise prescription.  The patient stated that their goals were to lose 30 pounds, build stamina, and get back to being active and preforming ADL's.  The patient stated that they understand the exercise prescription.  We reviewed exercise guidelines, target heart rate during exercise, oxygen use, weather, home pulse oximeter, endpoints for exercise, and goals.  Patient is encouraged to come to me with any questions. I will continue to follow up with the patient to assist them with progression and safety.

## 2016-06-22 ENCOUNTER — Encounter (HOSPITAL_COMMUNITY)
Admission: RE | Admit: 2016-06-22 | Discharge: 2016-06-22 | Disposition: A | Discharge status: Home / Self Care | Payer: No Typology Code available for payment source | Source: Ambulatory Visit | Attending: Pulmonary Disease | Admitting: Pulmonary Disease

## 2016-06-22 VITALS — Wt 156.1 lb

## 2016-06-22 DIAGNOSIS — J8410 Pulmonary fibrosis, unspecified: Secondary | ICD-10-CM | POA: Diagnosis not present

## 2016-06-22 NOTE — Progress Notes (Signed)
Daily Session Note  Patient Details  Name: Olivia [name] MRN: [id number] Date of Birth: [birth year] Referring Provider:   April Manson Pulmonary Rehab Walk Test from 05/11/2016 in [place]  Referring Provider  Dr. Gwenette Greet      Encounter Date: 06/22/2016  Check In:     Session Check In - 06/22/16 1132      Check-In   Location [facility]   Staff Present Rosebud Poles, RN, BSN;Molly diVincenzo, MS, ACSM RCEP, Exercise Physiologist;Lisa Ysidro Evert, Felipe Drone, RN, MHA;Portia Rollene Rotunda, RN, BSN   Supervising physician immediately available to respond to emergencies [facility] immediately available   Physician(s) Dr. Marthenia Rolling   Medication changes reported     No   Fall or balance concerns reported    No   Warm-up and Cool-down Performed as group-led instruction   Resistance Training Performed Yes   VAD Patient? No     Pain Assessment   Currently in Pain? No/denies   Multiple Pain Sites No      Capillary Blood Glucose: No results found for this or any previous visit (from the past 24 hour(s)).      Exercise Prescription Changes - 06/22/16 1200      Response to Exercise   Blood Pressure (Admit) 98/50   Blood Pressure (Exercise) 104/60   Blood Pressure (Exit) 102/66   Heart Rate (Admit) 98 bpm   Heart Rate (Exercise) 103 bpm   Heart Rate (Exit) 82 bpm   Oxygen Saturation (Admit) 93 %   Oxygen Saturation (Exercise) 94 %   Oxygen Saturation (Exit) 97 %   Rating of Perceived Exertion (Exercise) 11   Perceived Dyspnea (Exercise) 3   Duration Progress to 45 minutes of aerobic exercise without signs/symptoms of physical distress   Intensity THRR unchanged     Progression   Progression Continue to progress workloads to maintain intensity without signs/symptoms of physical distress.     Resistance Training   Training Prescription Yes   Weight orange bands   Reps 10-12  10 minutes of strength training     Interval  Training   Interval Training No     Oxygen   Oxygen Continuous     NuStep   Level 2   Minutes 17   METs 2.3     Arm Ergometer   Level 2   Minutes 17     Goals Met:  Exercise tolerated well Strength training completed today  Goals Unmet:  Not Applicable  Comments: Service time is from 1115 to 1315.  Attended Q & A session with Dr. Nelda Marseille, the medical director of pulmonary rehab.    Dr. Rush Farmer is Medical Director for Pulmonary Rehab at [facility].

## 2016-06-27 ENCOUNTER — Encounter (HOSPITAL_COMMUNITY): Payer: No Typology Code available for payment source

## 2016-06-29 ENCOUNTER — Encounter (HOSPITAL_COMMUNITY): Payer: No Typology Code available for payment source

## 2016-07-04 ENCOUNTER — Encounter (HOSPITAL_COMMUNITY): Admission: RE | Admit: 2016-07-04 | Payer: No Typology Code available for payment source | Source: Ambulatory Visit

## 2016-07-06 ENCOUNTER — Encounter (HOSPITAL_COMMUNITY)
Admission: RE | Admit: 2016-07-06 | Discharge: 2016-07-06 | Disposition: A | Discharge status: Home / Self Care | Payer: No Typology Code available for payment source | Source: Ambulatory Visit | Attending: Pulmonary Disease | Admitting: Pulmonary Disease

## 2016-07-06 VITALS — Wt 155.0 lb

## 2016-07-06 DIAGNOSIS — J8410 Pulmonary fibrosis, unspecified: Secondary | ICD-10-CM | POA: Diagnosis not present

## 2016-07-06 NOTE — Progress Notes (Signed)
Daily Session Note  Patient Details  Name: Olivia [name] MRN: [id number] Date of Birth: February 28, 1966 Referring Provider:   April Manson Pulmonary Rehab Walk Test from 05/11/2016 in [place]  Referring Provider  Dr. Gwenette Greet      Encounter Date: 07/06/2016  Check In:     Session Check In - 07/06/16 1037      Check-In   Location [facility]   Staff Present Rosebud Poles, RN, BSN;Ellwood Steidle, MS, ACSM RCEP, Exercise Physiologist;Lisa Ysidro Evert, Felipe Drone, RN, MHA;Portia Rollene Rotunda, RN, BSN   Supervising physician immediately available to respond to emergencies [facility] immediately available   Physician(s) Dr. Marily Memos   Medication changes reported     No   Fall or balance concerns reported    No   Warm-up and Cool-down Performed as group-led instruction   Resistance Training Performed Yes   VAD Patient? No     Pain Assessment   Currently in Pain? No/denies   Multiple Pain Sites No      Capillary Blood Glucose: No results found for this or any previous visit (from the past 24 hour(s)).      Exercise Prescription Changes - 07/06/16 1200      Response to Exercise   Blood Pressure (Admit) 102/70   Blood Pressure (Exercise) 106/66   Blood Pressure (Exit) 100/60   Heart Rate (Admit) 73 bpm   Heart Rate (Exercise) 85 bpm   Heart Rate (Exit) 75 bpm   Oxygen Saturation (Admit) 98 %   Oxygen Saturation (Exercise) 97 %   Oxygen Saturation (Exit) 99 %   Rating of Perceived Exertion (Exercise) 12   Perceived Dyspnea (Exercise) 1   Duration Progress to 45 minutes of aerobic exercise without signs/symptoms of physical distress   Intensity THRR unchanged     Progression   Progression Continue to progress workloads to maintain intensity without signs/symptoms of physical distress.     Resistance Training   Training Prescription Yes   Weight orange bands   Reps 10-12  10 minutes of strength training     Interval  Training   Interval Training No     Oxygen   Oxygen Continuous     Arm Ergometer   Level 2   Minutes 17     Track   Laps 12   Minutes 17     Goals Met:  Exercise tolerated well No report of cardiac concerns or symptoms Strength training completed today  Goals Unmet:  Not Applicable  Comments: Service time is from 10:30am to 12:00pm    Dr. Rush Farmer is Medical Director for Pulmonary Rehab at [facility].

## 2016-07-06 NOTE — Progress Notes (Signed)
Olivia [name] 50 y.o. female Nutrition Note Spoke with pt. Pt is obese. Pt wants to lose 30 lb and is frustrated re: wt gain over the past 2 years due to prednisone.  There are some ways the pt can make her eating habits healthier.  Pt's Rate Your Plate results reviewed with pt. 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 Pt's gastroenterologist recommended pt avoid high fiber foods and choose well-cooked, easy to digest foods. Discussed low-fiber, anti-inflammatory diet with pt due to autoimmune disease. Pt states she is not working at this time so she can focus on her health. Pt wants to cook more "but I don't like to cook." Meal prep delivery services discussed. Pt expressed understanding of the information reviewed via feedback method.    No results found for: HGBA1C  Nutrition Diagnosis ? Food-and nutrition-related knowledge deficit related to lack of exposure to information as related to diagnosis of pulmonary disease ? Obesity related to excessive energy intake as evidenced by a BMI of 30.1  Nutrition Intervention ? Pt's individual nutrition plan and goals reviewed with pt. ? Benefits of adopting healthy eating habits discussed when pt's Rate Your Plate reviewed. ? Handout given: Low Fiber Nutrition Therapy, GERD Nutrition Therapy ? Pt to attend the Nutrition and Lung Disease classContinual client-centered nutrition education by RD, as part of interdisciplinary care.  Goal(s) 1. Identify food quantities necessary to achieve wt loss of  -2# per week to a goal wt loss of 2.7-10.9 kg (6-24 lb) at graduation from pulmonary rehab. Monitor and Evaluate progress toward nutrition goal with team.   Derek Mound, M.Ed, RD, LDN, CDE 07/06/2016 1:35 PM

## 2016-07-07 ENCOUNTER — Telehealth: Payer: Self-pay | Admitting: Emergency Medicine

## 2016-07-07 NOTE — Telephone Encounter (Signed)
Rec'd faxed FMLA paperwork from [employer]. Sent interoffice to [employer] to document receipt. - 07/07/16-pr

## 2016-07-11 ENCOUNTER — Encounter (HOSPITAL_COMMUNITY)
Admission: RE | Admit: 2016-07-11 | Discharge: 2016-07-11 | Disposition: A | Discharge status: Home / Self Care | Payer: No Typology Code available for payment source | Source: Ambulatory Visit | Attending: Pulmonary Disease | Admitting: Pulmonary Disease

## 2016-07-11 VITALS — Wt 154.5 lb

## 2016-07-11 DIAGNOSIS — K219 Gastro-esophageal reflux disease without esophagitis: Secondary | ICD-10-CM | POA: Diagnosis not present

## 2016-07-11 DIAGNOSIS — Z7952 Long term (current) use of systemic steroids: Secondary | ICD-10-CM | POA: Diagnosis not present

## 2016-07-11 DIAGNOSIS — Z79899 Other long term (current) drug therapy: Secondary | ICD-10-CM | POA: Diagnosis not present

## 2016-07-11 DIAGNOSIS — J8410 Pulmonary fibrosis, unspecified: Secondary | ICD-10-CM | POA: Insufficient documentation

## 2016-07-11 DIAGNOSIS — F419 Anxiety disorder, unspecified: Secondary | ICD-10-CM | POA: Insufficient documentation

## 2016-07-11 DIAGNOSIS — J45909 Unspecified asthma, uncomplicated: Secondary | ICD-10-CM | POA: Diagnosis not present

## 2016-07-11 DIAGNOSIS — Z7951 Long term (current) use of inhaled steroids: Secondary | ICD-10-CM | POA: Insufficient documentation

## 2016-07-11 DIAGNOSIS — Z87891 Personal history of nicotine dependence: Secondary | ICD-10-CM | POA: Insufficient documentation

## 2016-07-11 DIAGNOSIS — F329 Major depressive disorder, single episode, unspecified: Secondary | ICD-10-CM | POA: Insufficient documentation

## 2016-07-11 DIAGNOSIS — E785 Hyperlipidemia, unspecified: Secondary | ICD-10-CM | POA: Insufficient documentation

## 2016-07-11 DIAGNOSIS — K7460 Unspecified cirrhosis of liver: Secondary | ICD-10-CM | POA: Diagnosis not present

## 2016-07-11 NOTE — Progress Notes (Signed)
Daily Session Note  Patient Details  Name: Olivia [name] MRN: [id number] Date of Birth: 1965-12-30 Referring Provider:   April Manson Pulmonary Rehab Walk Test from 05/11/2016 in [place]  Referring Provider  Dr. Gwenette Greet      Encounter Date: 07/11/2016  Check In:     Session Check In - 07/11/16 1110      Check-In   Location [facility]   Staff Present Rosebud Poles, RN, BSN;Koltan Portocarrero Ysidro Evert, RN;Olinty Frederick, MS, ACSM CEP, Exercise Physiologist;Annedrea Rosezella [place], RN, MHA;Portia Rollene Rotunda, RN, BSN   Supervising physician immediately available to respond to emergencies [facility] immediately available   Physician(s) Dr. Marily Memos   Medication changes reported     No   Fall or balance concerns reported    No   Warm-up and Cool-down Performed as group-led instruction   Resistance Training Performed Yes   VAD Patient? No     Pain Assessment   Currently in Pain? No/denies   Multiple Pain Sites No      Capillary Blood Glucose: No results found for this or any previous visit (from the past 24 hour(s)).      Exercise Prescription Changes - 07/11/16 1200      Response to Exercise   Blood Pressure (Admit) 94/80   Blood Pressure (Exercise) 110/64   Blood Pressure (Exit) 92/62   Heart Rate (Admit) 99 bpm   Heart Rate (Exercise) 112 bpm   Heart Rate (Exit) 86 bpm   Oxygen Saturation (Admit) 90 %   Oxygen Saturation (Exercise) 97 %   Oxygen Saturation (Exit) 100 %   Rating of Perceived Exertion (Exercise) 15   Perceived Dyspnea (Exercise) 2   Duration Progress to 45 minutes of aerobic exercise without signs/symptoms of physical distress   Intensity THRR unchanged     Progression   Progression Continue to progress workloads to maintain intensity without signs/symptoms of physical distress.     Resistance Training   Training Prescription Yes   Weight orange bands   Reps 10-12  10 minutes of strength training     Interval  Training   Interval Training No     Oxygen   Oxygen Continuous     NuStep   Level 2   Minutes 17   METs 1.9     Arm Ergometer   Level 2   Minutes 17     Track   Laps 18   Minutes 17     Goals Met:  Exercise tolerated well No report of cardiac concerns or symptoms Strength training completed today  Goals Unmet:  Not Applicable  Comments: Service time is from 1030 to 1215    Dr. Rush Farmer is Medical Director for Pulmonary Rehab at [facility].

## 2016-07-13 ENCOUNTER — Encounter (HOSPITAL_COMMUNITY)
Admission: RE | Admit: 2016-07-13 | Discharge: 2016-07-13 | Disposition: A | Discharge status: Home / Self Care | Payer: No Typology Code available for payment source | Source: Ambulatory Visit | Attending: Pulmonary Disease | Admitting: Pulmonary Disease

## 2016-07-13 VITALS — Wt 156.7 lb

## 2016-07-13 DIAGNOSIS — J8410 Pulmonary fibrosis, unspecified: Secondary | ICD-10-CM | POA: Diagnosis not present

## 2016-07-13 NOTE — Progress Notes (Signed)
Daily Session Note  Patient Details  Name: ARYAN BELLO MRN: [id number] Date of Birth: 1966/08/06 Referring Provider:   April Manson Pulmonary Rehab Walk Test from 05/11/2016 in [place]  Referring Provider  Dr. Gwenette Greet      Encounter Date: 07/13/2016  Check In:     Session Check In - 07/13/16 1054      Check-In   Location [facility]   Staff Present Rosebud Poles, RN, Luisa Hart, RN, BSN;Ramon Dredge, RN, MHA;Molly diVincenzo, MS, ACSM RCEP, Exercise Physiologist   Supervising physician immediately available to respond to emergencies [facility] immediately available   Physician(s) Dr. Allyson Sabal   Medication changes reported     No   Fall or balance concerns reported    No   Warm-up and Cool-down Performed as group-led instruction   Resistance Training Performed Yes   VAD Patient? No     Pain Assessment   Currently in Pain? No/denies   Multiple Pain Sites No      Capillary Blood Glucose: No results found for this or any previous visit (from the past 24 hour(s)).      Exercise Prescription Changes - 07/13/16 1228      Exercise Review   Progression Yes     Response to Exercise   Blood Pressure (Admit) 104/60   Blood Pressure (Exercise) 124/66   Blood Pressure (Exit) 120/82   Heart Rate (Admit) 94 bpm   Heart Rate (Exercise) 83 bpm   Heart Rate (Exit) 92 bpm   Oxygen Saturation (Admit) 95 %   Oxygen Saturation (Exercise) 99 %   Oxygen Saturation (Exit) 92 %   Rating of Perceived Exertion (Exercise) 11   Perceived Dyspnea (Exercise) 1   Duration Progress to 45 minutes of aerobic exercise without signs/symptoms of physical distress   Intensity THRR unchanged     Progression   Progression Continue to progress workloads to maintain intensity without signs/symptoms of physical distress.     Resistance Training   Training Prescription Yes   Weight orange bands   Reps 10-12  10 minutes of strength  training     Interval Training   Interval Training No     Oxygen   Oxygen Continuous     NuStep   Level 3   Minutes 17   METs 2.2     Arm Ergometer   Level --   Minutes --     Track   Laps --   Minutes --     Goals Met:  Independence with exercise equipment Improved SOB with ADL's Using PLB without cueing & demonstrates good technique Exercise tolerated well Strength training completed today  Goals Unmet:  NA  Comments: Service time is from 1030 to 1145. Patient only exercised on 1 station. Left early to pick up daughter.   Dr. Rush Farmer is Medical Director for Pulmonary Rehab at [facility].

## 2016-07-17 ENCOUNTER — Telehealth: Payer: Self-pay | Admitting: Pulmonary Disease

## 2016-07-17 ENCOUNTER — Encounter: Payer: Self-pay | Admitting: Internal Medicine

## 2016-07-17 NOTE — Telephone Encounter (Signed)
Rec'd FMLA paperwork from [employer] via fax. Sent forms to [employer] to document receipt via interoffice mail - 2nd request. -pr

## 2016-07-18 ENCOUNTER — Encounter (HOSPITAL_COMMUNITY)
Admission: RE | Admit: 2016-07-18 | Discharge: 2016-07-18 | Disposition: A | Discharge status: Home / Self Care | Payer: No Typology Code available for payment source | Source: Ambulatory Visit | Attending: Pulmonary Disease | Admitting: Pulmonary Disease

## 2016-07-18 VITALS — Wt 155.4 lb

## 2016-07-18 DIAGNOSIS — J8410 Pulmonary fibrosis, unspecified: Secondary | ICD-10-CM | POA: Diagnosis not present

## 2016-07-18 NOTE — Progress Notes (Signed)
Daily Session Note  Patient Details  Name: Olivia [name] MRN: [id number] Date of Birth: September 29, 1966 Referring Provider:   April Manson Pulmonary Rehab Walk Test from 05/11/2016 in [place]  Referring Provider  Dr. Gwenette Greet      Encounter Date: 07/18/2016  Check In:     Session Check In - 07/18/16 1207      Check-In   Location [facility]   Staff Present Su Hilt, MS, ACSM RCEP, Exercise Physiologist;Annedrea Rosezella [place], RN, MHA;Ruari Mudgett Ysidro Evert, RN;Portia Rollene Rotunda, RN, BSN   Supervising physician immediately available to respond to emergencies [facility] immediately available   Physician(s) Dr. Marily Memos   Medication changes reported     No   Fall or balance concerns reported    No   Warm-up and Cool-down Performed as group-led instruction   Resistance Training Performed Yes   VAD Patient? No     Pain Assessment   Currently in Pain? No/denies   Multiple Pain Sites No      Capillary Blood Glucose: No results found for this or any previous visit (from the past 24 hour(s)).      Exercise Prescription Changes - 07/18/16 1200      Response to Exercise   Blood Pressure (Admit) 102/68   Blood Pressure (Exercise) 108/62   Blood Pressure (Exit) 112/68   Heart Rate (Admit) 92 bpm   Heart Rate (Exercise) 113 bpm   Heart Rate (Exit) 97 bpm   Oxygen Saturation (Admit) 92 %   Oxygen Saturation (Exercise) 92 %   Oxygen Saturation (Exit) 98 %   Rating of Perceived Exertion (Exercise) 13   Perceived Dyspnea (Exercise) 2   Duration Progress to 45 minutes of aerobic exercise without signs/symptoms of physical distress   Intensity THRR unchanged     Progression   Progression Continue to progress workloads to maintain intensity without signs/symptoms of physical distress.     Resistance Training   Training Prescription Yes   Weight orange bands   Reps 10-12  10 minutes of strength training     Interval Training   Interval  Training No     Oxygen   Oxygen Continuous     NuStep   Level 3   Minutes 17   METs 2.3     Arm Ergometer   Level 2   Minutes 17     Track   Laps 15   Minutes 17     Goals Met:  Exercise tolerated well No report of cardiac concerns or symptoms Strength training completed today  Goals Unmet:  Not Applicable  Comments: Service time is from 1030 to 1210    Dr. Rush Farmer is Medical Director for Pulmonary Rehab at [facility].

## 2016-07-20 ENCOUNTER — Encounter (HOSPITAL_COMMUNITY)
Admission: RE | Admit: 2016-07-20 | Discharge: 2016-07-20 | Disposition: A | Discharge status: Home / Self Care | Payer: No Typology Code available for payment source | Source: Ambulatory Visit | Attending: Pulmonary Disease | Admitting: Pulmonary Disease

## 2016-07-20 VITALS — Wt 155.6 lb

## 2016-07-20 DIAGNOSIS — J8410 Pulmonary fibrosis, unspecified: Secondary | ICD-10-CM | POA: Diagnosis not present

## 2016-07-20 NOTE — Progress Notes (Signed)
Pulmonary Individual Treatment Plan  Patient Details  Name: Olivia [name] MRN: [id number] Date of Birth: May 02, 1966 Referring Provider:   April Manson Pulmonary Rehab Walk Test from 05/11/2016 in [place]  Referring Provider  Dr. Gwenette Greet      Initial Encounter Date:  Flowsheet Row Pulmonary Rehab Walk Test from 05/11/2016 in [facility]  Date  05/11/16  Referring Provider  Dr. Gwenette Greet      Visit Diagnosis: Pulmonary fibrosis, unspecified ([place])  Patient's Home Medications on Admission:   Current Outpatient Prescriptions:  .  calcium-vitamin D (OSCAL WITH D) 500-200 MG-UNIT tablet, Take 1 tablet by mouth., Disp: , Rfl:  .  cetirizine (ZYRTEC) 10 MG tablet, Take 10 mg by mouth daily., Disp: , Rfl:  .  codeine 30 MG tablet, Take 30 mg by mouth every 6 (six) hours as needed (cough)., Disp: , Rfl:  .  dextromethorphan-guaiFENesin (MUCINEX DM) 30-600 MG 12hr tablet, Take 1 tablet by mouth 2 (two) times daily as needed for cough., Disp: , Rfl:  .  estradiol (ESTRACE) 1 MG tablet, Take 1 mg by mouth daily., Disp: , Rfl:  .  FLUoxetine (PROZAC) 20 MG capsule, Take 40 mg by mouth 2 (two) times daily. , Disp: , Rfl:  .  fluticasone (FLONASE) 50 MCG/ACT nasal spray, Place 2 sprays into the nose daily., Disp: 16 g, Rfl: 2 .  medroxyPROGESTERone (PROVERA) 2.5 MG tablet, Take 2.5 mg by mouth daily., Disp: , Rfl:  .  mycophenolate (CELLCEPT) 500 MG tablet, Take 1,000 mg by mouth 2 (two) times daily., Disp: , Rfl:  .  pantoprazole (PROTONIX) 20 MG tablet, Take 20 mg by mouth 2 (two) times daily., Disp: , Rfl:  .  phenylephrine (SUDAFED PE) 10 MG TABS tablet, Take 10 mg by mouth daily., Disp: , Rfl:  .  predniSONE (DELTASONE) 20 MG tablet, Take 20 mg by mouth daily with breakfast., Disp: , Rfl:  .  rizatriptan (MAXALT) 10 MG tablet, Take 10 mg by mouth every 2 (two) hours as needed. May repeat in 2 hours if needed for migraines., Disp: , Rfl:   .  sulfamethoxazole-trimethoprim (BACTRIM DS,SEPTRA DS) 800-160 MG tablet, Take 1 tablet by mouth 3 (three) times a week., Disp: , Rfl:   Past Medical History: Past Medical History:  Diagnosis Date  . Adenomatous colon polyp   . Anxiety and depression   . Asthma   . Cirrhosis ([place])   . Colon polyps   . GERD (gastroesophageal reflux disease)   . Hyperlipidemia   . IBS (irritable bowel syndrome)   . Iron deficiency anemia   . Migraine headache   . MVP (mitral valve prolapse)   . NSIP (nonspecific interstitial pneumonia) ([place])   . Pulmonary fibrosis, unspecified ([place])   . Spontaneous pneumothorax     Tobacco Use: History  Smoking Status  . Former Smoker  . Packs/day: 5.00  . Years: 20.00  . Types: Cigarettes  . Quit date: 11/06/1998  Smokeless Tobacco  . Never Used    Labs: Recent Review Flowsheet Data    Labs for ITP Cardiac and Pulmonary Rehab Latest Ref Rng & Units 02/01/2010   Cholestrol 0 - 200 mg/dL 205(H)   LDLDIRECT mg/dL 139.1   HDL >39.00 mg/dL 63.10   Trlycerides 0.0 - 149.0 mg/dL 107.0      Capillary Blood Glucose: No results found for: GLUCAP   ADL UCSD:     Pulmonary Assessment Scores    Row  Name 05/17/16 1549         ADL UCSD   ADL Phase Entry     SOB Score total 67        Pulmonary Function Assessment:   Exercise Target Goals:    Exercise Program Goal: Individual exercise prescription set with THRR, safety & activity barriers. Participant demonstrates ability to understand and report RPE using BORG scale, to self-measure pulse accurately, and to acknowledge the importance of the exercise prescription.  Exercise Prescription Goal: Starting with aerobic activity 30 plus minutes a day, 3 days per week for initial exercise prescription. Provide home exercise prescription and guidelines that participant acknowledges understanding prior to discharge.  Activity Barriers & Risk Stratification:     Activity Barriers & Cardiac Risk  Stratification - 05/08/16 1123      Activity Barriers & Cardiac Risk Stratification   Activity Barriers Deconditioning;Shortness of Breath;Joint Problems      6 Minute Walk:     6 Minute Walk    Row Name 05/11/16 1638         6 Minute Walk   Phase Initial     Distance 1005 feet     Walk Time 6 minutes     # of Rest Breaks 0     MPH 1.9     METS 2.45     RPE 11     Perceived Dyspnea  1     Symptoms No     Resting HR 103 bpm     Resting BP 122/70     Max Ex. HR 119 bpm     Max Ex. BP 110/70       Interval HR   Baseline HR 103     1 Minute HR 112     2 Minute HR 119     3 Minute HR 118     4 Minute HR 115     5 Minute HR 116     6 Minute HR 101     2 Minute Post HR 96     Interval Heart Rate? Yes       Interval Oxygen   Interval Oxygen? Yes     Baseline Oxygen Saturation % 103 %     Baseline Liters of Oxygen 3 L     1 Minute Oxygen Saturation % 91 %     1 Minute Liters of Oxygen 3 L     2 Minute Oxygen Saturation % 86 %     2 Minute Liters of Oxygen 3 L     3 Minute Oxygen Saturation % 90 %     3 Minute Liters of Oxygen 4 L     4 Minute Oxygen Saturation % 88 %     4 Minute Liters of Oxygen 6 L     5 Minute Oxygen Saturation % 90 %     5 Minute Liters of Oxygen 6 L     6 Minute Oxygen Saturation % 98 %     6 Minute Liters of Oxygen 6 L     2 Minute Post Oxygen Saturation % 98 %     2 Minute Post Liters of Oxygen 6 L        Initial Exercise Prescription:     Initial Exercise Prescription - 05/11/16 1600      Date of Initial Exercise RX and Referring Provider   Date 05/11/16   Referring Provider Dr. Gwenette Greet     NuStep   Level 1  Minutes 17   METs 1.7     Arm Ergometer   Level 1   Minutes 17     Track   Laps 5   Minutes 17     Prescription Details   Frequency (times per week) 2   Duration Progress to 45 minutes of aerobic exercise without signs/symptoms of physical distress     Intensity   THRR 40-80% of Max Heartrate 68-135    Perceived Dyspnea 0-4     Progression   Progression Continue progressive overload as per policy without signs/symptoms or physical distress.     Resistance Training   Training Prescription Yes   Weight orange bands   Reps 10-12      Perform Capillary Blood Glucose checks as needed.  Exercise Prescription Changes:     Exercise Prescription Changes    Row Name 05/18/16 1200 05/23/16 1200 05/25/16 1300 06/06/16 1200 06/13/16 1200     Exercise Review   Progression  -  - Yes  -  -     Response to Exercise   Blood Pressure (Admit) 134/70 118/70 116/70 104/70 110/64   Blood Pressure (Exercise) 130/76 132/80 120/66 120/70 104/60   Blood Pressure (Exit) 110/60 104/60 100/64 100/60 114/60   Heart Rate (Admit) 85 bpm 98 bpm 75 bpm 104 bpm 92 bpm   Heart Rate (Exercise) 86 bpm 118 bpm 113 bpm 108 bpm 98 bpm   Heart Rate (Exit) 86 bpm 83 bpm 83 bpm 81 bpm 80 bpm   Oxygen Saturation (Admit) 99 % 90 % 95 % 89 % 97 %   Oxygen Saturation (Exercise) 98 % 91 % 91 % 94 % 95 %   Oxygen Saturation (Exit) 99 % 98 % 97 % 98 % 100 %   Rating of Perceived Exertion (Exercise) 13 11 11 13 13    Perceived Dyspnea (Exercise) 2 1 1 2 3    Duration Progress to 45 minutes of aerobic exercise without signs/symptoms of physical distress Progress to 45 minutes of aerobic exercise without signs/symptoms of physical distress Progress to 45 minutes of aerobic exercise without signs/symptoms of physical distress Progress to 45 minutes of aerobic exercise without signs/symptoms of physical distress Progress to 45 minutes of aerobic exercise without signs/symptoms of physical distress   Intensity THRR unchanged THRR unchanged THRR unchanged THRR unchanged THRR unchanged     Progression   Progression Continue to progress workloads to maintain intensity without signs/symptoms of physical distress. Continue to progress workloads to maintain intensity without signs/symptoms of physical distress. Continue to progress  workloads to maintain intensity without signs/symptoms of physical distress. Continue to progress workloads to maintain intensity without signs/symptoms of physical distress. Continue to progress workloads to maintain intensity without signs/symptoms of physical distress.     Resistance Training   Training Prescription Yes Yes Yes Yes Yes   Weight orange bands orange bands orange bands orange bands orange bands   Reps 10-12 10-12 10-12  10 minutes 10-12  10 minutes 10-12  10 minutes of strength training     Interval Training   Interval Training No No No No No     Oxygen   Oxygen Continuous Continuous Continuous Continuous Continuous   Liters 6 6 6 6 6      NuStep   Level 1 1 2 2 2    Minutes 17 17 17 17 17    METs 1.4 1.7 1.8 2.8 1.7     Arm Ergometer   Level 1 1  - 1 1   Minutes  18 17  - 17 53     Track   Laps  - 12 14 14 14    Minutes  - 17 17 17 17    Row Name 06/20/16 1200 06/22/16 1200 07/06/16 1200 07/11/16 1200 07/13/16 1228     Exercise Review   Progression Yes  -  -  - Yes     Response to Exercise   Blood Pressure (Admit) 98/60 98/50 102/70 94/80 104/60   Blood Pressure (Exercise) 118/64 104/60 106/66 110/64 124/66   Blood Pressure (Exit) 96/60 102/66 100/60 92/62 120/82   Heart Rate (Admit) 91 bpm 98 bpm 73 bpm 99 bpm 94 bpm   Heart Rate (Exercise) 110 bpm 103 bpm 85 bpm 112 bpm 83 bpm   Heart Rate (Exit) 90 bpm 82 bpm 75 bpm 86 bpm 92 bpm   Oxygen Saturation (Admit) 96 % 93 % 98 % 90 % 95 %   Oxygen Saturation (Exercise) 93 % 94 % 97 % 97 % 99 %   Oxygen Saturation (Exit) 100 % 97 % 99 % 100 % 92 %   Rating of Perceived Exertion (Exercise) 13 11 12 15 11    Perceived Dyspnea (Exercise) 2 3 1 2 1    Duration Progress to 45 minutes of aerobic exercise without signs/symptoms of physical distress Progress to 45 minutes of aerobic exercise without signs/symptoms of physical distress Progress to 45 minutes of aerobic exercise without signs/symptoms of physical distress  Progress to 45 minutes of aerobic exercise without signs/symptoms of physical distress Progress to 45 minutes of aerobic exercise without signs/symptoms of physical distress   Intensity THRR unchanged THRR unchanged THRR unchanged THRR unchanged THRR unchanged     Progression   Progression Continue to progress workloads to maintain intensity without signs/symptoms of physical distress. Continue to progress workloads to maintain intensity without signs/symptoms of physical distress. Continue to progress workloads to maintain intensity without signs/symptoms of physical distress. Continue to progress workloads to maintain intensity without signs/symptoms of physical distress. Continue to progress workloads to maintain intensity without signs/symptoms of physical distress.     Resistance Training   Training Prescription Yes Yes Yes Yes Yes   Weight orange bands orange bands orange bands orange bands orange bands   Reps 10-12  10 minutes of strength training 10-12  10 minutes of strength training 10-12  10 minutes of strength training 10-12  10 minutes of strength training 10-12  10 minutes of strength training     Interval Training   Interval Training No No No No No     Oxygen   Oxygen Continuous Continuous Continuous Continuous Continuous   Liters 6  -  -  -  -     NuStep   Level  - 2  - 2 3   Minutes  - 17  - 17 17   METs  - 2.3  - 1.9 2.2     Arm Ergometer   Level  - 2 2 2  -   Minutes  - 17 17 17  -     Track   Laps  -  - 12 18 -   Minutes  -  - 17 17 -     Home Exercise Plan   Plans to continue exercise at [place] 3 additional days to program exercise sessions.  -  -  -  -   Row Name 07/18/16 1200  Response to Exercise   Blood Pressure (Admit) 102/68       Blood Pressure (Exercise) 108/62       Blood Pressure (Exit) 112/68       Heart Rate (Admit) 92 bpm       Heart Rate (Exercise) 113 bpm       Heart Rate (Exit) 97 bpm        Oxygen Saturation (Admit) 92 %       Oxygen Saturation (Exercise) 92 %       Oxygen Saturation (Exit) 98 %       Rating of Perceived Exertion (Exercise) 13       Perceived Dyspnea (Exercise) 2       Duration Progress to 45 minutes of aerobic exercise without signs/symptoms of physical distress       Intensity THRR unchanged         Progression   Progression Continue to progress workloads to maintain intensity without signs/symptoms of physical distress.         Resistance Training   Training Prescription Yes       Weight orange bands       Reps 10-12  10 minutes of strength training         Interval Training   Interval Training No         Oxygen   Oxygen Continuous         NuStep   Level 3       Minutes 17       METs 2.3         Arm Ergometer   Level 2       Minutes 17         Track   Laps 15       Minutes 17          Exercise Comments:     Exercise Comments    Row Name 05/18/16 0752 06/20/16 0745 06/20/16 1547 07/17/16 1136     Exercise Comments Zonia is scheduled to begin her exercise sessions today, 05/18/16 Patient is slowly progressing workload intensities. Has only attended 5 excerise sessions. Will cont. to monitor.  Home exercise completed with patient  Patient is progressing steadily. Walking 18 laps in 15 minutes. Motivated to progress in workloads.        Discharge Exercise Prescription (Final Exercise Prescription Changes):     Exercise Prescription Changes - 07/18/16 1200      Response to Exercise   Blood Pressure (Admit) 102/68   Blood Pressure (Exercise) 108/62   Blood Pressure (Exit) 112/68   Heart Rate (Admit) 92 bpm   Heart Rate (Exercise) 113 bpm   Heart Rate (Exit) 97 bpm   Oxygen Saturation (Admit) 92 %   Oxygen Saturation (Exercise) 92 %   Oxygen Saturation (Exit) 98 %   Rating of Perceived Exertion (Exercise) 13   Perceived Dyspnea (Exercise) 2   Duration Progress to 45 minutes of aerobic exercise without signs/symptoms of  physical distress   Intensity THRR unchanged     Progression   Progression Continue to progress workloads to maintain intensity without signs/symptoms of physical distress.     Resistance Training   Training Prescription Yes   Weight orange bands   Reps 10-12  10 minutes of strength training     Interval Training   Interval Training No     Oxygen   Oxygen Continuous     NuStep   Level 3   Minutes 17  METs 2.3     Arm Ergometer   Level 2   Minutes 17     Track   Laps 15   Minutes 17       Nutrition:  Target Goals: Understanding of nutrition guidelines, daily intake of sodium 1500mg , cholesterol 200mg , calories 30% from fat and 7% or less from saturated fats, daily to have 5 or more servings of fruits and vegetables.  Biometrics:     Pre Biometrics - 05/08/16 1156      Pre Biometrics   Grip Strength 30 kg       Nutrition Therapy Plan and Nutrition Goals:     Nutrition Therapy & Goals - 07/06/16 1349      Nutrition Therapy   Diet General, healthful     Personal Nutrition Goals   Personal Goal #1 1-2 lb wt loss/week to a wt loss goal of 6-24 lb at graduation from [place], educate and counsel regarding individualized specific dietary modifications aiming towards targeted core components such as weight, hypertension, lipid management, diabetes, heart failure and other comorbidities.;Nutrition handout(s) given to patient.  Low Fiber Nutrition Therapy, GERD Nutrition Therapy   Expected Outcomes Short Term Goal: Understand basic principles of dietary content, such as calories, fat, sodium, cholesterol and nutrients.;Long Term Goal: Adherence to prescribed nutrition plan.      Nutrition Discharge: Rate Your Plate Scores:     Nutrition Assessments - 07/06/16 1349      Rate Your Plate Scores   Pre Score 52      Psychosocial: Target Goals: Acknowledge presence or absence of depression, maximize  coping skills, provide positive support system. Participant is able to verbalize types and ability to use techniques and skills needed for reducing stress and depression.  Initial Review & Psychosocial Screening:     Initial Psych Review & Screening - 05/08/16 [place]? Yes     Barriers   Psychosocial barriers to participate in program There are no identifiable barriers or psychosocial needs.     Screening Interventions   Interventions Encouraged to exercise      Quality of Life Scores:     Quality of Life - 05/17/16 1550      Quality of Life Scores   Health/Function Pre 12.5 %   Socioeconomic Pre 22.25 %   Psych/Spiritual Pre 17.14 %   Family Pre 19.75 %   GLOBAL Pre 16.6 %      PHQ-9: Recent Review Flowsheet Data    Depression screen [facility] 2/9 05/08/2016   Decreased Interest 0   Down, Depressed, Hopeless 0   PHQ - 2 Score 0      Psychosocial Evaluation and Intervention:   Psychosocial Re-Evaluation:     Psychosocial Re-Evaluation    Row Name 06/19/16 [phone] 07/18/16 0829           Psychosocial Re-Evaluation   Interventions Encouraged to attend Pulmonary Rehabilitation for the exercise Encouraged to attend Pulmonary Rehabilitation for the exercise      Comments no psycocial barriers identified during the past 30 days see comments section on ITP        Education: Education Goals: Education classes will be provided on a weekly basis, covering required topics. Participant will state understanding/return demonstration of topics presented.  Learning Barriers/Preferences:     Learning Barriers/Preferences - 05/08/16 1124      Learning Barriers/Preferences   Learning Barriers  None   Learning Preferences Written Material;Individual Instruction;Group Instruction      Education Topics: Risk Factor Reduction:  -Group instruction that is supported by a PowerPoint presentation. Instructor discusses the definition of a risk  factor, different risk factors for pulmonary disease, and how the heart and lungs work together.     Nutrition for Pulmonary Patient:  -Group instruction provided by PowerPoint slides, verbal discussion, and written materials to support subject matter. The instructor gives an explanation and review of healthy diet recommendations, which includes a discussion on weight management, recommendations for fruit and vegetable consumption, as well as protein, fluid, caffeine, fiber, sodium, sugar, and alcohol. Tips for eating when patients are short of breath are discussed.   Pursed Lip Breathing:  -Group instruction that is supported by demonstration and informational handouts. Instructor discusses the benefits of pursed lip and diaphragmatic breathing and detailed demonstration on how to preform both.   Flowsheet Row PULMONARY REHAB OTHER RESPIRATORY from 07/13/2016 in [place]  Date  07/13/16  Educator  RT  Instruction Review Code  2- meets goals/outcomes      Oxygen Safety:  -Group instruction provided by PowerPoint, verbal discussion, and written material to support subject matter. There is an overview of "What is Oxygen" and "Why do we need it".  Instructor also reviews how to create a safe environment for oxygen use, the importance of using oxygen as prescribed, and the risks of noncompliance. There is a brief discussion on traveling with oxygen and resources the patient may utilize.   Oxygen Equipment:  -Group instruction provided by [facility] Staff utilizing handouts, written materials, and equipment demonstrations.   Signs and Symptoms:  -Group instruction provided by written material and verbal discussion to support subject matter. Warning signs and symptoms of infection, stroke, and heart attack are reviewed and when to call the physician/911 reinforced. Tips for preventing the spread of infection discussed. Flowsheet Row PULMONARY REHAB OTHER  RESPIRATORY from 07/13/2016 in [place]  Date  05/18/16  Educator  RN  Instruction Review Code  2- meets goals/outcomes      Advanced Directives:  -Group instruction provided by verbal instruction and written material to support subject matter. Instructor reviews Advanced Directive laws and proper instruction for filling out document.   Pulmonary Video:  -Group video education that reviews the importance of medication and oxygen compliance, exercise, good nutrition, pulmonary hygiene, and pursed lip and diaphragmatic breathing for the pulmonary patient. Flowsheet Row PULMONARY REHAB OTHER RESPIRATORY from 07/13/2016 in [place]  Date  07/06/16  Instruction Review Code  2- meets goals/outcomes      Exercise for the Pulmonary Patient:  -Group instruction that is supported by a PowerPoint presentation. Instructor discusses benefits of exercise, core components of exercise, frequency, duration, and intensity of an exercise routine, importance of utilizing pulse oximetry during exercise, safety while exercising, and options of places to exercise outside of rehab.     Pulmonary Medications:  -Verbally interactive group education provided by instructor with focus on inhaled medications and proper administration.   Anatomy and Physiology of the Respiratory System and Intimacy:  -Group instruction provided by PowerPoint, verbal discussion, and written material to support subject matter. Instructor reviews respiratory cycle and anatomical components of the respiratory system and their functions. Instructor also reviews differences in obstructive and restrictive respiratory diseases with examples of each. Intimacy, Sex, and Sexuality differences are reviewed with a discussion on how relationships can  change when diagnosed with pulmonary disease. Common sexual concerns are reviewed. Flowsheet Row PULMONARY REHAB OTHER RESPIRATORY from  07/13/2016 in [place]  Date  05/25/16  Educator  RN  Instruction Review Code  2- meets goals/outcomes      Knowledge Questionnaire Score:     Knowledge Questionnaire Score - 05/17/16 1549      Knowledge Questionnaire Score   Pre Score 11/13      Core Components/Risk Factors/Patient Goals at Admission:     Personal Goals and Risk Factors at Admission - 05/08/16 1116      Core Components/Risk Factors/Patient Goals on Admission    Weight Management Yes;Weight Loss   Intervention Weight Management: Develop a combined nutrition and exercise program designed to reach desired caloric intake, while maintaining appropriate intake of nutrient and fiber, sodium and fats, and appropriate energy expenditure required for the weight goal.   Admit Weight 155 lb 3.3 oz (70.4 kg)   Increase Strength and Stamina Yes   Intervention Provide advice, education, support and counseling about physical activity/exercise needs.;Develop an individualized exercise prescription for aerobic and resistive training based on initial evaluation findings, risk stratification, comorbidities and participant's personal goals.   Expected Outcomes Achievement of increased cardiorespiratory fitness and enhanced flexibility, muscular endurance and strength shown through measurements of functional capacity and personal statement of participant.   Improve shortness of breath with ADL's Yes   Intervention Provide education, individualized exercise plan and daily activity instruction to help decrease symptoms of SOB with activities of daily living.   Expected Outcomes Short Term: Achieves a reduction of symptoms when performing activities of daily living.   Develop more efficient breathing techniques such as purse lipped breathing and diaphragmatic breathing; and practicing self-pacing with activity Yes   Intervention Provide education, demonstration and support about specific breathing techniuqes  utilized for more efficient breathing. Include techniques such as pursed lipped breathing, diaphragmatic breathing and self-pacing activity.   Expected Outcomes Short Term: Participant will be able to demonstrate and use breathing techniques as needed throughout daily activities.   Increase knowledge of respiratory medications and ability to use respiratory devices properly  Yes   Intervention Provide education and demonstration as needed of appropriate use of medications, inhalers, and oxygen therapy.   Expected Outcomes Short Term: Achieves understanding of medications use. Understands that oxygen is a medication prescribed by physician. Demonstrates appropriate use of inhaler and oxygen therapy.      Core Components/Risk Factors/Patient Goals Review:      Goals and Risk Factor Review    Row Name 06/19/16 0925 07/18/16 0829           Core Components/Risk Factors/Patient Goals Review   Personal Goals Review Weight Management/Obesity;Increase knowledge of respiratory medications and ability to use respiratory devices properly.;Improve shortness of breath with ADL's;Increase Strength and Stamina;Develop more efficient breathing techniques such as purse lipped breathing and diaphragmatic breathing and practicing self-pacing with activity. Weight Management/Obesity;Increase knowledge of respiratory medications and ability to use respiratory devices properly.;Improve shortness of breath with ADL's;Increase Strength and Stamina;Develop more efficient breathing techniques such as purse lipped breathing and diaphragmatic breathing and practicing self-pacing with activity.      Review see "comments" section on ITP see "comments" section on ITP      Expected Outcomes see Admission expected outcomes see Admission expected outcomes         Core Components/Risk Factors/Patient Goals at Discharge (Final Review):      Goals and Risk Factor Review - 07/18/16  0829      Core Components/Risk  Factors/Patient Goals Review   Personal Goals Review Weight Management/Obesity;Increase knowledge of respiratory medications and ability to use respiratory devices properly.;Improve shortness of breath with ADL's;Increase Strength and Stamina;Develop more efficient breathing techniques such as purse lipped breathing and diaphragmatic breathing and practicing self-pacing with activity.   Review see "comments" section on ITP   Expected Outcomes see Admission expected outcomes      ITP Comments:   Comments: ITP REVIEW Pt is making slow progress toward pulmonary rehab goals after completing 11 sessions. Vee continues to see progression in her pulmonary symptoms even with being compliant with her oxygen and CPAP therapy. She has scheduled an appointment with Dr. Lake Bells for a second opinion later this month. 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 She works very hard while in pulmonary rehab. Recommend continued exercise, life style modification, education, and utilization of breathing techniques to increase stamina and strength and decrease shortness of breath with exertion. Will continue to monitor her response to exercise very closely. She may begin the process of transplant evaluation if suggested by Dr. Lake Bells. Her primary pulmonologist at the [facility] has suggested she "give it a thought".

## 2016-07-20 NOTE — Progress Notes (Signed)
Daily Session Note  Patient Details  Name: Olivia [name] MRN: [id number] Date of Birth: 07-18-1966 Referring Provider:   April Manson Pulmonary Rehab Walk Test from 05/11/2016 in [place]  Referring Provider  Dr. Gwenette Greet      Encounter Date: 07/20/2016  Check In:     Session Check In - 07/20/16 1037      Check-In   Location [facility]   Staff Present Rosebud Poles, RN, BSN;Molly diVincenzo, MS, ACSM RCEP, Exercise Physiologist;Krishauna Schatzman Ysidro Evert, Felipe Drone, RN, MHA;Portia Rollene Rotunda, RN, BSN   Supervising physician immediately available to respond to emergencies [facility] immediately available   Physician(s) Dr. Waldron Labs   Medication changes reported     No   Fall or balance concerns reported    No   Warm-up and Cool-down Performed as group-led instruction   Resistance Training Performed Yes   VAD Patient? No     Pain Assessment   Currently in Pain? No/denies   Multiple Pain Sites No      Capillary Blood Glucose: No results found for this or any previous visit (from the past 24 hour(s)).      Exercise Prescription Changes - 07/20/16 1200      Exercise Review   Progression Yes     Response to Exercise   Blood Pressure (Admit) 94/54   Blood Pressure (Exercise) 108/64   Blood Pressure (Exit) 100/60   Heart Rate (Admit) 94 bpm   Heart Rate (Exercise) 118 bpm   Heart Rate (Exit) 96 bpm   Oxygen Saturation (Admit) 92 %   Oxygen Saturation (Exercise) 91 %   Oxygen Saturation (Exit) 96 %   Rating of Perceived Exertion (Exercise) 13   Perceived Dyspnea (Exercise) 3   Duration Progress to 45 minutes of aerobic exercise without signs/symptoms of physical distress   Intensity THRR unchanged     Progression   Progression Continue to progress workloads to maintain intensity without signs/symptoms of physical distress.     Resistance Training   Training Prescription Yes   Weight orange bands   Reps 10-12  10  minutes of strength training     Interval Training   Interval Training No     Oxygen   Oxygen Continuous   Liters 6     Arm Ergometer   Level 3   Minutes 17     Track   Laps 18   Minutes 17     Goals Met:  Exercise tolerated well No report of cardiac concerns or symptoms Strength training completed today  Goals Unmet:  Not Applicable  Comments: Service time is from 1030 to 1205     Dr. Rush Farmer is Medical Director for Pulmonary Rehab at [facility].

## 2016-07-25 ENCOUNTER — Encounter (HOSPITAL_COMMUNITY)
Admission: RE | Admit: 2016-07-25 | Discharge: 2016-07-25 | Disposition: A | Discharge status: Home / Self Care | Payer: No Typology Code available for payment source | Source: Ambulatory Visit | Attending: Pulmonary Disease | Admitting: Pulmonary Disease

## 2016-07-25 VITALS — Wt 153.4 lb

## 2016-07-25 DIAGNOSIS — J8410 Pulmonary fibrosis, unspecified: Secondary | ICD-10-CM

## 2016-07-25 NOTE — Progress Notes (Signed)
Daily Session Note  Patient Details  Name: Olivia [name] MRN: [id number] Date of Birth: June 08, 1966 Referring Provider:   April Manson Pulmonary Rehab Walk Test from 05/11/2016 in [place]  Referring Provider  Dr. Gwenette Greet      Encounter Date: 07/25/2016  Check In:     Session Check In - 07/25/16 1030      Check-In   [facility]   Staff Present Rosebud Poles, RN, BSN;Molly diVincenzo, MS, ACSM RCEP, Exercise Physiologist;Lisa Ysidro Evert, RN;Joann Rion, RN, BSN   Physician(s) Dr. Waldron Labs   Medication changes reported     No   Fall or balance concerns reported    No   Warm-up and Cool-down Performed as group-led instruction   Resistance Training Performed Yes   VAD Patient? No     Pain Assessment   Currently in Pain? No/denies   Multiple Pain Sites No      Capillary Blood Glucose: No results found for this or any previous visit (from the past 24 hour(s)).      Exercise Prescription Changes - 07/25/16 1241      Response to Exercise   Blood Pressure (Admit) 100/72   Blood Pressure (Exercise) 108/52   Blood Pressure (Exit) 92/60   Heart Rate (Admit) 94 bpm   Heart Rate (Exercise) 112 bpm   Heart Rate (Exit) 90 bpm   Oxygen Saturation (Admit) 98 %   Oxygen Saturation (Exercise) 96 %   Oxygen Saturation (Exit) 99 %   Rating of Perceived Exertion (Exercise) 13   Perceived Dyspnea (Exercise) 2   Duration Progress to 45 minutes of aerobic exercise without signs/symptoms of physical distress   Intensity THRR unchanged     Progression   Progression Continue to progress workloads to maintain intensity without signs/symptoms of physical distress.     Resistance Training   Training Prescription Yes   Weight orange bands   Reps 10-12  10 minutes of strength training     Interval Training   Interval Training No     Oxygen   Oxygen Continuous   Liters 6     NuStep   Level 5   Minutes 17   METs 2.4     Arm  Ergometer   Level 3   Minutes 17     Track   Laps 15   Minutes 17     Goals Met:  Improved SOB with ADL's Using PLB without cueing & demonstrates good technique Exercise tolerated well Personal goals reviewed No report of cardiac concerns or symptoms Strength training completed today  Goals Unmet: PD  Comments: Service time is from 1030 to 1210   Dr. Rush Farmer is Medical Director for Pulmonary Rehab at [facility].

## 2016-07-27 ENCOUNTER — Encounter (HOSPITAL_COMMUNITY)
Admission: RE | Admit: 2016-07-27 | Discharge: 2016-07-27 | Disposition: A | Discharge status: Home / Self Care | Payer: No Typology Code available for payment source | Source: Ambulatory Visit | Attending: Pulmonary Disease | Admitting: Pulmonary Disease

## 2016-07-27 VITALS — Wt 154.8 lb

## 2016-07-27 DIAGNOSIS — J8410 Pulmonary fibrosis, unspecified: Secondary | ICD-10-CM

## 2016-07-27 DIAGNOSIS — J45909 Unspecified asthma, uncomplicated: Secondary | ICD-10-CM | POA: Diagnosis not present

## 2016-07-27 DIAGNOSIS — Z79899 Other long term (current) drug therapy: Secondary | ICD-10-CM | POA: Diagnosis not present

## 2016-07-27 DIAGNOSIS — Z87891 Personal history of nicotine dependence: Secondary | ICD-10-CM | POA: Diagnosis not present

## 2016-07-27 NOTE — Progress Notes (Signed)
Daily Session Note  Patient Details  Name: KALANDRA MASTERS MRN: [id number] Date of Birth: 09/30/1966 Referring Provider:   April Manson Pulmonary Rehab Walk Test from 05/11/2016 in [place]  Referring Provider  Dr. Gwenette Greet      Encounter Date: 07/27/2016  Check In:     Session Check In - 07/27/16 1101      Check-In   Location [facility]   Staff Present Su Hilt, MS, ACSM RCEP, Exercise Physiologist;Ardra Kuznicki Rollene Rotunda, Therapist, sports, BSN;Ramon Dredge, RN, MHA;Lisa Ysidro Evert, RN;Joan Leonia Reeves, RN, BSN   Supervising physician immediately available to respond to emergencies [facility] immediately available   Physician(s) Dr. Dyann Kief   Medication changes reported     No   Fall or balance concerns reported    No   Warm-up and Cool-down Performed as group-led instruction   Resistance Training Performed Yes   VAD Patient? No     Pain Assessment   Currently in Pain? No/denies   Multiple Pain Sites No      Capillary Blood Glucose: No results found for this or any previous visit (from the past 24 hour(s)).      Exercise Prescription Changes - 07/27/16 1300      Response to Exercise   Blood Pressure (Admit) 100/60   Blood Pressure (Exit) 101/71   Heart Rate (Admit) 104 bpm   Heart Rate (Exit) 95 bpm   Oxygen Saturation (Admit) 96 %   Oxygen Saturation (Exit) 96 %     Oxygen   Oxygen Continuous   Liters 6     Track   Laps 3   Minutes 5     Goals Met:  Using PLB without cueing & demonstrates good technique  Goals Unmet:  RPE PD  Chest discomfort  Comments: Service time is from 1030 to 1230. See incomplete sessions note.   Dr. Rush Farmer is Medical Director for Pulmonary Rehab at [facility].

## 2016-07-27 NOTE — Progress Notes (Signed)
Incomplete Session Note  Patient Details  Name: Olivia [name] MRN: [id number] Date of Birth: [birth year] Referring Provider:   April Manson Pulmonary Rehab Walk Test from 05/11/2016 in [place]  Referring Provider  Dr. Felton Clinton did not complete her rehab session. While walking the track, [name] developed chest tightness 6/10 with pain radiating between her shoulder blades L>R. [name] states this patient happens intermittently with exertion and sometimes at rest. She also describes left arm numbness with some of these episodes. Her vitals remain stable. Unremarkable EKG. Primary MD contacted at [facility]. Spoke with Andee Poles, RN at [place]. Ekg faxed. Requested appt for patient. Andee Poles will follow up with patient at home. Patients symptoms resolved completely prior to discharge. See flow sheet for vitals documentation.

## 2016-08-01 ENCOUNTER — Encounter (HOSPITAL_COMMUNITY): Payer: No Typology Code available for payment source

## 2016-08-02 ENCOUNTER — Institutional Professional Consult (permissible substitution): Payer: Managed Care, Other (non HMO) | Admitting: Pulmonary Disease

## 2016-08-03 ENCOUNTER — Encounter (HOSPITAL_COMMUNITY)
Admission: RE | Admit: 2016-08-03 | Discharge: 2016-08-03 | Disposition: A | Discharge status: Home / Self Care | Payer: No Typology Code available for payment source | Source: Ambulatory Visit | Attending: Pulmonary Disease | Admitting: Pulmonary Disease

## 2016-08-03 VITALS — Wt 154.1 lb

## 2016-08-03 DIAGNOSIS — J8410 Pulmonary fibrosis, unspecified: Secondary | ICD-10-CM | POA: Diagnosis not present

## 2016-08-03 NOTE — Progress Notes (Signed)
Daily Session Note  Patient Details  Name: Olivia [name] MRN: [id number] Date of Birth: [birth year] Referring Provider:   April Manson Pulmonary Rehab Walk Test from 05/11/2016 in [place]  Referring Provider  Dr. Gwenette Greet      Encounter Date: 08/03/2016  Check In:     Session Check In - 08/03/16 1105      Check-In   Location [facility]   Staff Present Rosebud Poles, RN, BSN;Molly diVincenzo, MS, ACSM RCEP, Exercise Physiologist;Omkar Stratmann Ysidro Evert, RN;Portia Rollene Rotunda, RN, BSN   Supervising physician immediately available to respond to emergencies [facility] immediately available   Physician(s) DR. Cathlean Sauer   Medication changes reported     No   Fall or balance concerns reported    No   Warm-up and Cool-down Performed as group-led instruction   Resistance Training Performed Yes   VAD Patient? No     Pain Assessment   Currently in Pain? No/denies   Multiple Pain Sites No      Capillary Blood Glucose: No results found for this or any previous visit (from the past 24 hour(s)).      Exercise Prescription Changes - 08/03/16 1300      Exercise Review   Progression Yes     Response to Exercise   Blood Pressure (Admit) 90/50   Blood Pressure (Exercise) 100/60   Blood Pressure (Exit) 96/50   Heart Rate (Admit) 92 bpm   Heart Rate (Exercise) 100 bpm   Heart Rate (Exit) 83 bpm   Oxygen Saturation (Admit) 95 %   Oxygen Saturation (Exercise) 94 %   Oxygen Saturation (Exit) 99 %   Rating of Perceived Exertion (Exercise) 12   Perceived Dyspnea (Exercise) 1.5   Duration Progress to 45 minutes of aerobic exercise without signs/symptoms of physical distress   Intensity THRR unchanged     Progression   Progression Continue to progress workloads to maintain intensity without signs/symptoms of physical distress.     Resistance Training   Training Prescription Yes   Weight orange bands   Reps 10-12  10 minutes of strength training      Interval Training   Interval Training No     Oxygen   Oxygen Continuous   Liters 6     NuStep   Level 6   Minutes 17   METs 2.5     Arm Ergometer   Level 3   Minutes 17     Goals Met:  Exercise tolerated well No report of cardiac concerns or symptoms Strength training completed today  Goals Unmet:  Not Applicable  Comments: Service time is from 1030 to 1220    Dr. Rush Farmer is Medical Director for Pulmonary Rehab at [facility].

## 2016-08-08 ENCOUNTER — Encounter (HOSPITAL_COMMUNITY)
Admission: RE | Admit: 2016-08-08 | Discharge: 2016-08-08 | Disposition: A | Discharge status: Home / Self Care | Payer: No Typology Code available for payment source | Source: Ambulatory Visit | Attending: Pulmonary Disease | Admitting: Pulmonary Disease

## 2016-08-08 VITALS — Wt 157.2 lb

## 2016-08-08 DIAGNOSIS — E785 Hyperlipidemia, unspecified: Secondary | ICD-10-CM | POA: Diagnosis not present

## 2016-08-08 DIAGNOSIS — J8410 Pulmonary fibrosis, unspecified: Secondary | ICD-10-CM | POA: Diagnosis not present

## 2016-08-08 DIAGNOSIS — F329 Major depressive disorder, single episode, unspecified: Secondary | ICD-10-CM | POA: Diagnosis not present

## 2016-08-08 DIAGNOSIS — Z7952 Long term (current) use of systemic steroids: Secondary | ICD-10-CM | POA: Diagnosis not present

## 2016-08-08 DIAGNOSIS — Z87891 Personal history of nicotine dependence: Secondary | ICD-10-CM | POA: Diagnosis not present

## 2016-08-08 DIAGNOSIS — Z79899 Other long term (current) drug therapy: Secondary | ICD-10-CM | POA: Insufficient documentation

## 2016-08-08 DIAGNOSIS — K7460 Unspecified cirrhosis of liver: Secondary | ICD-10-CM | POA: Diagnosis not present

## 2016-08-08 DIAGNOSIS — Z7951 Long term (current) use of inhaled steroids: Secondary | ICD-10-CM | POA: Diagnosis not present

## 2016-08-08 DIAGNOSIS — J45909 Unspecified asthma, uncomplicated: Secondary | ICD-10-CM | POA: Diagnosis not present

## 2016-08-08 DIAGNOSIS — F419 Anxiety disorder, unspecified: Secondary | ICD-10-CM | POA: Insufficient documentation

## 2016-08-08 DIAGNOSIS — K219 Gastro-esophageal reflux disease without esophagitis: Secondary | ICD-10-CM | POA: Diagnosis not present

## 2016-08-08 NOTE — Progress Notes (Signed)
Daily Session Note  Patient Details  Name: Olivia [name] MRN: [id number] Date of Birth: November 16, 1965 Referring Provider:   April Manson Pulmonary Rehab Walk Test from 05/11/2016 in [place]  Referring Provider  Dr. Gwenette Greet      Encounter Date: 08/08/2016  Check In:     Session Check In - 08/08/16 1043      Check-In   Location [facility]   Staff Present Su Hilt, MS, ACSM RCEP, Exercise Physiologist;Joan Leonia Reeves, RN, BSN;Lisa Hughes, RN;Avanish Cerullo Rollene Rotunda, RN, BSN   Supervising physician immediately available to respond to emergencies [facility] immediately available   Physician(s) dr. Maylene Roes   Medication changes reported     No   Fall or balance concerns reported    No   Warm-up and Cool-down Performed as group-led instruction   Resistance Training Performed Yes   VAD Patient? No     Pain Assessment   Currently in Pain? No/denies   Multiple Pain Sites No      Capillary Blood Glucose: No results found for this or any previous visit (from the past 24 hour(s)).      Exercise Prescription Changes - 08/08/16 1223      Response to Exercise   Blood Pressure (Admit) 110/62   Blood Pressure (Exercise) 110/68   Blood Pressure (Exit) 96/54   Heart Rate (Admit) 83 bpm   Heart Rate (Exercise) 102 bpm   Heart Rate (Exit) 73 bpm   Oxygen Saturation (Admit) 91 %   Oxygen Saturation (Exercise) 87 %   Oxygen Saturation (Exit) 99 %   Rating of Perceived Exertion (Exercise) 12   Perceived Dyspnea (Exercise) 1.5   Duration Progress to 45 minutes of aerobic exercise without signs/symptoms of physical distress   Intensity THRR unchanged     Progression   Progression Continue to progress workloads to maintain intensity without signs/symptoms of physical distress.     Resistance Training   Training Prescription Yes   Weight orange bands   Reps 10-12  10 minutes of strength training     Interval Training   Interval Training  No     Oxygen   Oxygen Continuous   Liters 6     NuStep   Level 6   Minutes 17   METs 2.6     Arm Ergometer   Level 3   Minutes 17     Track   Laps 16   Minutes 17     Goals Met:  Independence with exercise equipment Using PLB without cueing & demonstrates good technique Exercise tolerated well No report of cardiac concerns or symptoms Strength training completed today  Goals Unmet:  Not Applicable  Comments: Service time is from 1030 to 1210   Dr. Rush Farmer is Medical Director for Pulmonary Rehab at [facility].

## 2016-08-10 ENCOUNTER — Encounter (HOSPITAL_COMMUNITY): Payer: No Typology Code available for payment source

## 2016-08-15 ENCOUNTER — Encounter (HOSPITAL_COMMUNITY)
Admission: RE | Admit: 2016-08-15 | Discharge: 2016-08-15 | Disposition: A | Discharge status: Home / Self Care | Payer: No Typology Code available for payment source | Source: Ambulatory Visit | Attending: Pulmonary Disease | Admitting: Pulmonary Disease

## 2016-08-15 DIAGNOSIS — J8410 Pulmonary fibrosis, unspecified: Secondary | ICD-10-CM

## 2016-08-15 NOTE — Progress Notes (Signed)
Pulmonary Individual Treatment Plan  Patient Details  Name: Olivia [name] MRN: [id number] Date of Birth: 09/11/1966 Referring Provider:   April Manson Pulmonary Rehab Walk Test from 05/11/2016 in [facility]  Referring Provider  Dr. Gwenette Greet      Initial Encounter Date:  Flowsheet Row Pulmonary Rehab Walk Test from 05/11/2016 in [facility]  Date  05/11/16  Referring Provider  Dr. Gwenette Greet      Visit Diagnosis: Pulmonary fibrosis, unspecified ([place])  Patient's Home Medications on Admission:   Current Outpatient Prescriptions:  .  calcium-vitamin D (OSCAL WITH D) 500-200 MG-UNIT tablet, Take 1 tablet by mouth., Disp: , Rfl:  .  cetirizine (ZYRTEC) 10 MG tablet, Take 10 mg by mouth daily., Disp: , Rfl:  .  codeine 30 MG tablet, Take 30 mg by mouth every 6 (six) hours as needed (cough)., Disp: , Rfl:  .  dextromethorphan-guaiFENesin (MUCINEX DM) 30-600 MG 12hr tablet, Take 1 tablet by mouth 2 (two) times daily as needed for cough., Disp: , Rfl:  .  estradiol (ESTRACE) 1 MG tablet, Take 1 mg by mouth daily., Disp: , Rfl:  .  FLUoxetine (PROZAC) 20 MG capsule, Take 40 mg by mouth 2 (two) times daily. , Disp: , Rfl:  .  fluticasone (FLONASE) 50 MCG/ACT nasal spray, Place 2 sprays into the nose daily., Disp: 16 g, Rfl: 2 .  medroxyPROGESTERone (PROVERA) 2.5 MG tablet, Take 2.5 mg by mouth daily., Disp: , Rfl:  .  mycophenolate (CELLCEPT) 500 MG tablet, Take 1,000 mg by mouth 2 (two) times daily., Disp: , Rfl:  .  pantoprazole (PROTONIX) 20 MG tablet, Take 20 mg by mouth 2 (two) times daily., Disp: , Rfl:  .  phenylephrine (SUDAFED PE) 10 MG TABS tablet, Take 10 mg by mouth daily., Disp: , Rfl:  .  predniSONE (DELTASONE) 20 MG tablet, Take 20 mg by mouth daily with breakfast., Disp: , Rfl:  .  rizatriptan (MAXALT) 10 MG tablet, Take 10 mg by mouth every 2 (two) hours as needed. May repeat in 2 hours if needed for migraines., Disp: , Rfl:   .  sulfamethoxazole-trimethoprim (BACTRIM DS,SEPTRA DS) 800-160 MG tablet, Take 1 tablet by mouth 3 (three) times a week., Disp: , Rfl:   Past Medical History: Past Medical History:  Diagnosis Date  . Adenomatous colon polyp   . Anxiety and depression   . Asthma   . Cirrhosis ([place])   . Colon polyps   . GERD (gastroesophageal reflux disease)   . Hyperlipidemia   . IBS (irritable bowel syndrome)   . Iron deficiency anemia   . Migraine headache   . MVP (mitral valve prolapse)   . NSIP (nonspecific interstitial pneumonia) ([place])   . Pulmonary fibrosis, unspecified ([place])   . Spontaneous pneumothorax     Tobacco Use: History  Smoking Status  . Former Smoker  . Packs/day: 5.00  . Years: 20.00  . Types: Cigarettes  . Quit date: 11/06/1998  Smokeless Tobacco  . Never Used    Labs: Recent Review Flowsheet Data    Labs for ITP Cardiac and Pulmonary Rehab Latest Ref Rng & Units 02/01/2010   Cholestrol 0 - 200 mg/dL 205(H)   LDLDIRECT mg/dL 139.1   HDL >39.00 mg/dL 63.10   Trlycerides 0.0 - 149.0 mg/dL 107.0      Capillary Blood Glucose: No results found for: GLUCAP   ADL UCSD:     Pulmonary Assessment Scores    Row  Name 05/17/16 1549         ADL UCSD   ADL Phase Entry     SOB Score total 67        Pulmonary Function Assessment:   Exercise Target Goals:    Exercise Program Goal: Individual exercise prescription set with THRR, safety & activity barriers. Participant demonstrates ability to understand and report RPE using BORG scale, to self-measure pulse accurately, and to acknowledge the importance of the exercise prescription.  Exercise Prescription Goal: Starting with aerobic activity 30 plus minutes a day, 3 days per week for initial exercise prescription. Provide home exercise prescription and guidelines that participant acknowledges understanding prior to discharge.  Activity Barriers & Risk Stratification:     Activity Barriers & Cardiac Risk  Stratification - 05/08/16 1123      Activity Barriers & Cardiac Risk Stratification   Activity Barriers Deconditioning;Shortness of Breath;Joint Problems      6 Minute Walk:     6 Minute Walk    Row Name 05/11/16 1638         6 Minute Walk   Phase Initial     Distance 1005 feet     Walk Time 6 minutes     # of Rest Breaks 0     MPH 1.9     METS 2.45     RPE 11     Perceived Dyspnea  1     Symptoms No     Resting HR 103 bpm     Resting BP 122/70     Max Ex. HR 119 bpm     Max Ex. BP 110/70       Interval HR   Baseline HR 103     1 Minute HR 112     2 Minute HR 119     3 Minute HR 118     4 Minute HR 115     5 Minute HR 116     6 Minute HR 101     2 Minute Post HR 96     Interval Heart Rate? Yes       Interval Oxygen   Interval Oxygen? Yes     Baseline Oxygen Saturation % 103 %     Baseline Liters of Oxygen 3 L     1 Minute Oxygen Saturation % 91 %     1 Minute Liters of Oxygen 3 L     2 Minute Oxygen Saturation % 86 %     2 Minute Liters of Oxygen 3 L     3 Minute Oxygen Saturation % 90 %     3 Minute Liters of Oxygen 4 L     4 Minute Oxygen Saturation % 88 %     4 Minute Liters of Oxygen 6 L     5 Minute Oxygen Saturation % 90 %     5 Minute Liters of Oxygen 6 L     6 Minute Oxygen Saturation % 98 %     6 Minute Liters of Oxygen 6 L     2 Minute Post Oxygen Saturation % 98 %     2 Minute Post Liters of Oxygen 6 L        Initial Exercise Prescription:     Initial Exercise Prescription - 05/11/16 1600      Date of Initial Exercise RX and Referring Provider   Date 05/11/16   Referring Provider Dr. Gwenette Greet     NuStep   Level 1  Minutes 17   METs 1.7     Arm Ergometer   Level 1   Minutes 17     Track   Laps 5   Minutes 17     Prescription Details   Frequency (times per week) 2   Duration Progress to 45 minutes of aerobic exercise without signs/symptoms of physical distress     Intensity   THRR 40-80% of Max Heartrate 68-135    Perceived Dyspnea 0-4     Progression   Progression Continue progressive overload as per policy without signs/symptoms or physical distress.     Resistance Training   Training Prescription Yes   Weight orange bands   Reps 10-12      Perform Capillary Blood Glucose checks as needed.  Exercise Prescription Changes:     Exercise Prescription Changes    Row Name 05/18/16 1200 05/23/16 1200 05/25/16 1300 06/06/16 1200 06/13/16 1200     Exercise Review   Progression  -  - Yes  -  -     Response to Exercise   Blood Pressure (Admit) 134/70 118/70 116/70 104/70 110/64   Blood Pressure (Exercise) 130/76 132/80 120/66 120/70 104/60   Blood Pressure (Exit) 110/60 104/60 100/64 100/60 114/60   Heart Rate (Admit) 85 bpm 98 bpm 75 bpm 104 bpm 92 bpm   Heart Rate (Exercise) 86 bpm 118 bpm 113 bpm 108 bpm 98 bpm   Heart Rate (Exit) 86 bpm 83 bpm 83 bpm 81 bpm 80 bpm   Oxygen Saturation (Admit) 99 % 90 % 95 % 89 % 97 %   Oxygen Saturation (Exercise) 98 % 91 % 91 % 94 % 95 %   Oxygen Saturation (Exit) 99 % 98 % 97 % 98 % 100 %   Rating of Perceived Exertion (Exercise) 13 11 11 13 13    Perceived Dyspnea (Exercise) 2 1 1 2 3    Duration Progress to 45 minutes of aerobic exercise without signs/symptoms of physical distress Progress to 45 minutes of aerobic exercise without signs/symptoms of physical distress Progress to 45 minutes of aerobic exercise without signs/symptoms of physical distress Progress to 45 minutes of aerobic exercise without signs/symptoms of physical distress Progress to 45 minutes of aerobic exercise without signs/symptoms of physical distress   Intensity THRR unchanged THRR unchanged THRR unchanged THRR unchanged THRR unchanged     Progression   Progression Continue to progress workloads to maintain intensity without signs/symptoms of physical distress. Continue to progress workloads to maintain intensity without signs/symptoms of physical distress. Continue to progress  workloads to maintain intensity without signs/symptoms of physical distress. Continue to progress workloads to maintain intensity without signs/symptoms of physical distress. Continue to progress workloads to maintain intensity without signs/symptoms of physical distress.     Resistance Training   Training Prescription Yes Yes Yes Yes Yes   Weight orange bands orange bands orange bands orange bands orange bands   Reps 10-12 10-12 10-12  10 minutes 10-12  10 minutes 10-12  10 minutes of strength training     Interval Training   Interval Training No No No No No     Oxygen   Oxygen Continuous Continuous Continuous Continuous Continuous   Liters 6 6 6 6 6      NuStep   Level 1 1 2 2 2    Minutes 17 17 17 17 17    METs 1.4 1.7 1.8 2.8 1.7     Arm Ergometer   Level 1 1  - 1 1   Minutes  30 17  - 17 17     Track   Laps  - 12 14 14 14    Minutes  - 17 17 17 17    Row Name 06/20/16 1200 06/22/16 1200 07/06/16 1200 07/11/16 1200 07/13/16 1228     Exercise Review   Progression Yes  -  -  - Yes     Response to Exercise   Blood Pressure (Admit) 98/60 98/50 102/70 94/80 104/60   Blood Pressure (Exercise) 118/64 104/60 106/66 110/64 124/66   Blood Pressure (Exit) 96/60 102/66 100/60 92/62 120/82   Heart Rate (Admit) 91 bpm 98 bpm 73 bpm 99 bpm 94 bpm   Heart Rate (Exercise) 110 bpm 103 bpm 85 bpm 112 bpm 83 bpm   Heart Rate (Exit) 90 bpm 82 bpm 75 bpm 86 bpm 92 bpm   Oxygen Saturation (Admit) 96 % 93 % 98 % 90 % 95 %   Oxygen Saturation (Exercise) 93 % 94 % 97 % 97 % 99 %   Oxygen Saturation (Exit) 100 % 97 % 99 % 100 % 92 %   Rating of Perceived Exertion (Exercise) 13 11 12 15 11    Perceived Dyspnea (Exercise) 2 3 1 2 1    Duration Progress to 45 minutes of aerobic exercise without signs/symptoms of physical distress Progress to 45 minutes of aerobic exercise without signs/symptoms of physical distress Progress to 45 minutes of aerobic exercise without signs/symptoms of physical distress  Progress to 45 minutes of aerobic exercise without signs/symptoms of physical distress Progress to 45 minutes of aerobic exercise without signs/symptoms of physical distress   Intensity THRR unchanged THRR unchanged THRR unchanged THRR unchanged THRR unchanged     Progression   Progression Continue to progress workloads to maintain intensity without signs/symptoms of physical distress. Continue to progress workloads to maintain intensity without signs/symptoms of physical distress. Continue to progress workloads to maintain intensity without signs/symptoms of physical distress. Continue to progress workloads to maintain intensity without signs/symptoms of physical distress. Continue to progress workloads to maintain intensity without signs/symptoms of physical distress.     Resistance Training   Training Prescription Yes Yes Yes Yes Yes   Weight orange bands orange bands orange bands orange bands orange bands   Reps 10-12  10 minutes of strength training 10-12  10 minutes of strength training 10-12  10 minutes of strength training 10-12  10 minutes of strength training 10-12  10 minutes of strength training     Interval Training   Interval Training No No No No No     Oxygen   Oxygen Continuous Continuous Continuous Continuous Continuous   Liters 6  -  -  -  -     NuStep   Level  - 2  - 2 3   Minutes  - 17  - 17 17   METs  - 2.3  - 1.9 2.2     Arm Ergometer   Level  - 2 2 2  -   Minutes  - 17 17 17  -     Track   Laps  -  - 12 18 -   Minutes  -  - 17 17 -     Home Exercise Plan   Plans to continue exercise at [place] 3 additional days to program exercise sessions.  -  -  -  -   Row Name 07/18/16 1200 07/20/16 1200 07/25/16 1241 07/27/16 1300 08/03/16 1300  Exercise Review   Progression  - Yes  -  - Yes     Response to Exercise   Blood Pressure (Admit) 102/68 94/54 100/72 100/60 90/50   Blood Pressure (Exercise) 108/62 108/64 108/52  - 100/60    Blood Pressure (Exit) 112/68 100/60 92/60 101/71 96/50   Heart Rate (Admit) 92 bpm 94 bpm 94 bpm 104 bpm 92 bpm   Heart Rate (Exercise) 113 bpm 118 bpm 112 bpm  - 100 bpm   Heart Rate (Exit) 97 bpm 96 bpm 90 bpm 95 bpm 83 bpm   Oxygen Saturation (Admit) 92 % 92 % 98 % 96 % 95 %   Oxygen Saturation (Exercise) 92 % 91 % 96 %  - 94 %   Oxygen Saturation (Exit) 98 % 96 % 99 % 96 % 99 %   Rating of Perceived Exertion (Exercise) 13 13 13   - 12   Perceived Dyspnea (Exercise) 2 3 2   - 1.5   Duration Progress to 45 minutes of aerobic exercise without signs/symptoms of physical distress Progress to 45 minutes of aerobic exercise without signs/symptoms of physical distress Progress to 45 minutes of aerobic exercise without signs/symptoms of physical distress  - Progress to 45 minutes of aerobic exercise without signs/symptoms of physical distress   Intensity THRR unchanged THRR unchanged THRR unchanged  - THRR unchanged     Progression   Progression Continue to progress workloads to maintain intensity without signs/symptoms of physical distress. Continue to progress workloads to maintain intensity without signs/symptoms of physical distress. Continue to progress workloads to maintain intensity without signs/symptoms of physical distress.  - Continue to progress workloads to maintain intensity without signs/symptoms of physical distress.     Resistance Training   Training Prescription Yes Yes Yes  - Yes   Weight orange bands orange bands orange bands  - orange bands   Reps 10-12  10 minutes of strength training 10-12  10 minutes of strength training 10-12  10 minutes of strength training  - 10-12  10 minutes of strength training     Interval Training   Interval Training No No No  - No     Oxygen   Oxygen Continuous Continuous Continuous Continuous Continuous   Liters  - 6 6 6 6      NuStep   Level 3  - 5  - 6   Minutes 17  - 17  - 17   METs 2.3  - 2.4  - 2.5     Arm Ergometer   Level 2 3 3   -  3   Minutes 17 17 17   - 17     Track   Laps 15 18 15 3   -   Minutes 17 17 17 5   -   Row Name 08/08/16 1223             Response to Exercise   Blood Pressure (Admit) 110/62       Blood Pressure (Exercise) 110/68       Blood Pressure (Exit) 96/54       Heart Rate (Admit) 83 bpm       Heart Rate (Exercise) 102 bpm       Heart Rate (Exit) 73 bpm       Oxygen Saturation (Admit) 91 %       Oxygen Saturation (Exercise) 87 %       Oxygen Saturation (Exit) 99 %       Rating of Perceived Exertion (Exercise) 12  Perceived Dyspnea (Exercise) 1.5       Duration Progress to 45 minutes of aerobic exercise without signs/symptoms of physical distress       Intensity THRR unchanged         Progression   Progression Continue to progress workloads to maintain intensity without signs/symptoms of physical distress.         Resistance Training   Training Prescription Yes       Weight orange bands       Reps 10-12  10 minutes of strength training         Interval Training   Interval Training No         Oxygen   Oxygen Continuous       Liters 6         NuStep   Level 6       Minutes 17       METs 2.6         Arm Ergometer   Level 3       Minutes 17         Track   Laps 16       Minutes 17          Exercise Comments:     Exercise Comments    Row Name 05/18/16 0752 06/20/16 0745 06/20/16 1547 07/17/16 1136 08/14/16 0944   Exercise Comments Taeylor is scheduled to begin her exercise sessions today, 05/18/16 Patient is slowly progressing workload intensities. Has only attended 5 excerise sessions. Will cont. to monitor.  Home exercise completed with patient  Patient is progressing steadily. Walking 18 laps in 15 minutes. Motivated to progress in workloads.  Patient has had a few setbacks. However, is still increasing workloads. Will cont. to monitor and progress.      Discharge Exercise Prescription (Final Exercise Prescription Changes):     Exercise Prescription Changes -  08/08/16 1223      Response to Exercise   Blood Pressure (Admit) 110/62   Blood Pressure (Exercise) 110/68   Blood Pressure (Exit) 96/54   Heart Rate (Admit) 83 bpm   Heart Rate (Exercise) 102 bpm   Heart Rate (Exit) 73 bpm   Oxygen Saturation (Admit) 91 %   Oxygen Saturation (Exercise) 87 %   Oxygen Saturation (Exit) 99 %   Rating of Perceived Exertion (Exercise) 12   Perceived Dyspnea (Exercise) 1.5   Duration Progress to 45 minutes of aerobic exercise without signs/symptoms of physical distress   Intensity THRR unchanged     Progression   Progression Continue to progress workloads to maintain intensity without signs/symptoms of physical distress.     Resistance Training   Training Prescription Yes   Weight orange bands   Reps 10-12  10 minutes of strength training     Interval Training   Interval Training No     Oxygen   Oxygen Continuous   Liters 6     NuStep   Level 6   Minutes 17   METs 2.6     Arm Ergometer   Level 3   Minutes 17     Track   Laps 16   Minutes 17       Nutrition:  Target Goals: Understanding of nutrition guidelines, daily intake of sodium 1500mg , cholesterol 200mg , calories 30% from fat and 7% or less from saturated fats, daily to have 5 or more servings of fruits and vegetables.  Biometrics:     Pre Biometrics - 05/08/16 1156  Pre Biometrics   Grip Strength 30 kg       Nutrition Therapy Plan and Nutrition Goals:     Nutrition Therapy & Goals - 07/06/16 1349      Nutrition Therapy   Diet General, healthful     Personal Nutrition Goals   Personal Goal #1 1-2 lb wt loss/week to a wt loss goal of 6-24 lb at graduation from [place], educate and counsel regarding individualized specific dietary modifications aiming towards targeted core components such as weight, hypertension, lipid management, diabetes, heart failure and other comorbidities.;Nutrition handout(s)  given to patient.  Low Fiber Nutrition Therapy, GERD Nutrition Therapy   Expected Outcomes Short Term Goal: Understand basic principles of dietary content, such as calories, fat, sodium, cholesterol and nutrients.;Long Term Goal: Adherence to prescribed nutrition plan.      Nutrition Discharge: Rate Your Plate Scores:     Nutrition Assessments - 07/06/16 1349      Rate Your Plate Scores   Pre Score 52      Psychosocial: Target Goals: Acknowledge presence or absence of depression, maximize coping skills, provide positive support system. Participant is able to verbalize types and ability to use techniques and skills needed for reducing stress and depression.  Initial Review & Psychosocial Screening:     Initial Psych Review & Screening - 05/08/16 [place]? Yes     Barriers   Psychosocial barriers to participate in program There are no identifiable barriers or psychosocial needs.     Screening Interventions   Interventions Encouraged to exercise      Quality of Life Scores:     Quality of Life - 05/17/16 1550      Quality of Life Scores   Health/Function Pre 12.5 %   Socioeconomic Pre 22.25 %   Psych/Spiritual Pre 17.14 %   Family Pre 19.75 %   GLOBAL Pre 16.6 %      PHQ-9: Recent Review Flowsheet Data    Depression screen [facility] 2/9 05/08/2016   Decreased Interest 0   Down, Depressed, Hopeless 0   PHQ - 2 Score 0      Psychosocial Evaluation and Intervention:   Psychosocial Re-Evaluation:     Psychosocial Re-Evaluation    Row Name 06/19/16 [id number] 07/18/16 0829 08/15/16 0834         Psychosocial Re-Evaluation   Interventions Encouraged to attend Pulmonary Rehabilitation for the exercise Encouraged to attend Pulmonary Rehabilitation for the exercise Encouraged to attend Pulmonary Rehabilitation for the exercise     Comments no psycocial barriers identified during the past 30 days see comments section on ITP see  comments section on ITP       Education: Education Goals: Education classes will be provided on a weekly basis, covering required topics. Participant will state understanding/return demonstration of topics presented.  Learning Barriers/Preferences:     Learning Barriers/Preferences - 05/08/16 1124      Learning Barriers/Preferences   Learning Barriers None   Learning Preferences Written Material;Individual Instruction;Group Instruction      Education Topics: Risk Factor Reduction:  -Group instruction that is supported by a PowerPoint presentation. Instructor discusses the definition of a risk factor, different risk factors for pulmonary disease, and how the heart and lungs work together.     Nutrition for Pulmonary Patient:  -Group instruction provided by PowerPoint slides, verbal discussion, and written materials to support subject matter. The  instructor gives an explanation and review of healthy diet recommendations, which includes a discussion on weight management, recommendations for fruit and vegetable consumption, as well as protein, fluid, caffeine, fiber, sodium, sugar, and alcohol. Tips for eating when patients are short of breath are discussed.   Pursed Lip Breathing:  -Group instruction that is supported by demonstration and informational handouts. Instructor discusses the benefits of pursed lip and diaphragmatic breathing and detailed demonstration on how to preform both.   Flowsheet Row PULMONARY REHAB OTHER RESPIRATORY from 08/03/2016 in [place]  Date  07/13/16  Educator  RT  Instruction Review Code  2- meets goals/outcomes      Oxygen Safety:  -Group instruction provided by PowerPoint, verbal discussion, and written material to support subject matter. There is an overview of "What is Oxygen" and "Why do we need it".  Instructor also reviews how to create a safe environment for oxygen use, the importance of using oxygen as  prescribed, and the risks of noncompliance. There is a brief discussion on traveling with oxygen and resources the patient may utilize.   Oxygen Equipment:  -Group instruction provided by [facility] Staff utilizing handouts, written materials, and equipment demonstrations.   Signs and Symptoms:  -Group instruction provided by written material and verbal discussion to support subject matter. Warning signs and symptoms of infection, stroke, and heart attack are reviewed and when to call the physician/911 reinforced. Tips for preventing the spread of infection discussed. Flowsheet Row PULMONARY REHAB OTHER RESPIRATORY from 08/03/2016 in [place]  Date  05/18/16  Educator  RN  Instruction Review Code  2- meets goals/outcomes      Advanced Directives:  -Group instruction provided by verbal instruction and written material to support subject matter. Instructor reviews Advanced Directive laws and proper instruction for filling out document.   Pulmonary Video:  -Group video education that reviews the importance of medication and oxygen compliance, exercise, good nutrition, pulmonary hygiene, and pursed lip and diaphragmatic breathing for the pulmonary patient. Flowsheet Row PULMONARY REHAB OTHER RESPIRATORY from 08/03/2016 in [place]  Date  07/06/16  Instruction Review Code  2- meets goals/outcomes      Exercise for the Pulmonary Patient:  -Group instruction that is supported by a PowerPoint presentation. Instructor discusses benefits of exercise, core components of exercise, frequency, duration, and intensity of an exercise routine, importance of utilizing pulse oximetry during exercise, safety while exercising, and options of places to exercise outside of rehab.     Pulmonary Medications:  -Verbally interactive group education provided by instructor with focus on inhaled medications and proper administration.   Anatomy  and Physiology of the Respiratory System and Intimacy:  -Group instruction provided by PowerPoint, verbal discussion, and written material to support subject matter. Instructor reviews respiratory cycle and anatomical components of the respiratory system and their functions. Instructor also reviews differences in obstructive and restrictive respiratory diseases with examples of each. Intimacy, Sex, and Sexuality differences are reviewed with a discussion on how relationships can change when diagnosed with pulmonary disease. Common sexual concerns are reviewed. Flowsheet Row PULMONARY REHAB OTHER RESPIRATORY from 08/03/2016 in [place]  Date  05/25/16  Educator  RN  Instruction Review Code  2- meets goals/outcomes      Knowledge Questionnaire Score:     Knowledge Questionnaire Score - 05/17/16 1549      Knowledge Questionnaire Score   Pre Score 11/13  Core Components/Risk Factors/Patient Goals at Admission:     Personal Goals and Risk Factors at Admission - 05/08/16 1116      Core Components/Risk Factors/Patient Goals on Admission    Weight Management Yes;Weight Loss   Intervention Weight Management: Develop a combined nutrition and exercise program designed to reach desired caloric intake, while maintaining appropriate intake of nutrient and fiber, sodium and fats, and appropriate energy expenditure required for the weight goal.   Admit Weight 155 lb 3.3 oz (70.4 kg)   Increase Strength and Stamina Yes   Intervention Provide advice, education, support and counseling about physical activity/exercise needs.;Develop an individualized exercise prescription for aerobic and resistive training based on initial evaluation findings, risk stratification, comorbidities and participant's personal goals.   Expected Outcomes Achievement of increased cardiorespiratory fitness and enhanced flexibility, muscular endurance and strength shown through measurements of  functional capacity and personal statement of participant.   Improve shortness of breath with ADL's Yes   Intervention Provide education, individualized exercise plan and daily activity instruction to help decrease symptoms of SOB with activities of daily living.   Expected Outcomes Short Term: Achieves a reduction of symptoms when performing activities of daily living.   Develop more efficient breathing techniques such as purse lipped breathing and diaphragmatic breathing; and practicing self-pacing with activity Yes   Intervention Provide education, demonstration and support about specific breathing techniuqes utilized for more efficient breathing. Include techniques such as pursed lipped breathing, diaphragmatic breathing and self-pacing activity.   Expected Outcomes Short Term: Participant will be able to demonstrate and use breathing techniques as needed throughout daily activities.   Increase knowledge of respiratory medications and ability to use respiratory devices properly  Yes   Intervention Provide education and demonstration as needed of appropriate use of medications, inhalers, and oxygen therapy.   Expected Outcomes Short Term: Achieves understanding of medications use. Understands that oxygen is a medication prescribed by physician. Demonstrates appropriate use of inhaler and oxygen therapy.      Core Components/Risk Factors/Patient Goals Review:      Goals and Risk Factor Review    Row Name 06/19/16 0925 07/18/16 0829 08/15/16 0834         Core Components/Risk Factors/Patient Goals Review   Personal Goals Review Weight Management/Obesity;Increase knowledge of respiratory medications and ability to use respiratory devices properly.;Improve shortness of breath with ADL's;Increase Strength and Stamina;Develop more efficient breathing techniques such as purse lipped breathing and diaphragmatic breathing and practicing self-pacing with activity. Weight Management/Obesity;Increase  knowledge of respiratory medications and ability to use respiratory devices properly.;Improve shortness of breath with ADL's;Increase Strength and Stamina;Develop more efficient breathing techniques such as purse lipped breathing and diaphragmatic breathing and practicing self-pacing with activity. Weight Management/Obesity;Increase knowledge of respiratory medications and ability to use respiratory devices properly.;Improve shortness of breath with ADL's;Increase Strength and Stamina;Develop more efficient breathing techniques such as purse lipped breathing and diaphragmatic breathing and practicing self-pacing with activity.     Review see "comments" section on ITP see "comments" section on ITP see "comments" section on ITP     Expected Outcomes see Admission expected outcomes see Admission expected outcomes see Admission expected outcomes        Core Components/Risk Factors/Patient Goals at Discharge (Final Review):      Goals and Risk Factor Review - 08/15/16 0834      Core Components/Risk Factors/Patient Goals Review   Personal Goals Review Weight Management/Obesity;Increase knowledge of respiratory medications and ability to use respiratory devices properly.;Improve shortness of breath with ADL's;Increase  Strength and Stamina;Develop more efficient breathing techniques such as purse lipped breathing and diaphragmatic breathing and practicing self-pacing with activity.   Review see "comments" section on ITP   Expected Outcomes see Admission expected outcomes      ITP Comments:   Comments: ITP REVIEW Pt is not making expected progress toward pulmonary rehab goals after completing 16 sessions. I have requested and received approval for her to continue to exercise in pulmonary rehab in order to complete her approved 36 sessions. Claiborne Billings attendance has been spotty related to her continual digression. She complains of more shortness of breath and less stamina on a daily basis. Her oxygen has been  increased to maintain saturations > 90%. She has seen her primary care for questionable worsening of her pulmonary disease, and has an appointment with a local pulmonologist tomorrow for a second opinion. Recommend continued exercise, life style modification, education, and utilization of breathing techniques to increase stamina and strength and decrease shortness of breath with exertion.

## 2016-08-16 ENCOUNTER — Ambulatory Visit (INDEPENDENT_AMBULATORY_CARE_PROVIDER_SITE_OTHER): Payer: Managed Care, Other (non HMO) | Admitting: Pulmonary Disease

## 2016-08-16 ENCOUNTER — Encounter: Payer: Self-pay | Admitting: Pulmonary Disease

## 2016-08-16 VITALS — BP 134/70 | HR 73 | Ht 60.5 in | Wt 154.4 lb

## 2016-08-16 DIAGNOSIS — J9611 Chronic respiratory failure with hypoxia: Secondary | ICD-10-CM

## 2016-08-16 DIAGNOSIS — I272 Pulmonary hypertension, unspecified: Secondary | ICD-10-CM

## 2016-08-16 DIAGNOSIS — J849 Interstitial pulmonary disease, unspecified: Secondary | ICD-10-CM | POA: Insufficient documentation

## 2016-08-16 DIAGNOSIS — R0600 Dyspnea, unspecified: Secondary | ICD-10-CM | POA: Diagnosis not present

## 2016-08-16 NOTE — Progress Notes (Signed)
Subjective:    Patient ID: Olivia [name], female    DOB: 09/06/1966, 50 y.o.   MRN: [id number]  HPI Chief Complaint  Patient presents with  . Advice Only    Self referral for second opinion for pulmonary fibrosis.  pt is former RB pt, sees [place] through the [place] currently.     [name] was previously seen by my partner here in our clinic through 2014 but around that time she had some financial difficulty so she switched to the [facility] hospital in [place].  She was having more dyspnea at the time and there was concern for fibrosis due to worsening dyspnea.    She has seen [facility] in the past but most recently her rheumatology care has been managed by the [facility] up until lately.  She was initially labeled as having rheumatoid arthritis, but this diagnosis has evolved over time in the last several years.  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  It sounds as if this has been based on a concern for Raynaud's, "GI motility issues" and her clubbing.  Auto-antibodies have not been positive.    Her dyspnea has progressed since 2014 and she went on oxygen just three to four months ago.  She notes that back in 2014 she had some degree of dyspnea with exertion when she would really push her exercise with cycling.  She had a lot of fatigue then.  This was complicated by joint pain as well.  However since then it has progressed significantly She is now short of breath even at rest.  She says that the last three to four months have been particularly bad.  She now can't walk around the house without dyspnea, but as recent as Christmas of 2016 she was able to walk around the house and do house work.  Now the dyspnea has progressed.  She just had pulmonary function testing recently which she said was worse than prior to her thoracic surgery.  She had her open lung biopsy in September 2016 at the [facility].  She was diagnosed with organizing pneumonia and NSIP (fibrosing).  She has been  on prednisone since July 2016 and she started on mycophenolate in February 2017.  She had the flu in early 2017.  She is also on bactrim.     Past Medical History:  Diagnosis Date  . Adenomatous colon polyp   . Anxiety and depression   . Asthma   . Cirrhosis ([place])   . Colon polyps   . GERD (gastroesophageal reflux disease)   . Hyperlipidemia   . IBS (irritable bowel syndrome)   . Iron deficiency anemia   . Migraine headache   . MVP (mitral valve prolapse)   . NSIP (nonspecific interstitial pneumonia) ([place])   . Pulmonary fibrosis, unspecified ([place])   . Spontaneous pneumothorax      Family History  Problem Relation Age of Onset  . Hypertension Father   . Lung cancer Mother   . Colon polyps Mother   . Stroke Mother   . Diabetes Maternal Grandfather   . Cancer Brother     oral squamous cell  . Colitis Maternal Aunt   . Diverticulitis Brother     also mother     Social History   Social History  . Marital status: Divorced    Spouse name: N/A  . Number of children: 2  . Years of education: N/A   Occupational History  . reception/ [employer]   Social History Main Topics  . Smoking status: Former Smoker    Packs/day: 0.50    Years: 20.00    Types: Cigarettes    Quit date: 11/06/1998  . Smokeless tobacco: Never Used  . Alcohol use 0.0 oz/week     Comment: socially  . Drug use: No  . Sexual activity: No   Other Topics Concern  . Not on file   Social History Narrative  . No narrative on file     Allergies  Allergen Reactions  . Darvocet [Propoxyphene N-Acetaminophen] Hives  . Hydrocodone Hives  . Oxycodone Hives  . Vicodin [Hydrocodone-Acetaminophen] Hives     Outpatient Medications Prior to Visit  Medication Sig Dispense Refill  . calcium-vitamin D (OSCAL WITH D) 500-200 MG-UNIT tablet Take 1 tablet by mouth.    . codeine 30 MG tablet Take 10 mg by mouth every 6 (six) hours as needed (cough).     Marland Kitchen dextromethorphan-guaiFENesin (MUCINEX  DM) 30-600 MG 12hr tablet Take 1 tablet by mouth 2 (two) times daily as needed for cough.    . estradiol (ESTRACE) 1 MG tablet Take 1 mg by mouth daily.    Marland Kitchen FLUoxetine (PROZAC) 20 MG capsule Take 40 mg by mouth 2 (two) times daily.     . fluticasone (FLONASE) 50 MCG/ACT nasal spray Place 2 sprays into the nose daily. 16 g 2  . medroxyPROGESTERone (PROVERA) 2.5 MG tablet Take 2.5 mg by mouth daily.    . mycophenolate (CELLCEPT) 500 MG tablet Take 1,000 mg by mouth 2 (two) times daily.    . pantoprazole (PROTONIX) 20 MG tablet Take 20 mg by mouth 2 (two) times daily.    . predniSONE (DELTASONE) 20 MG tablet Take 20 mg by mouth daily with breakfast.    . sulfamethoxazole-trimethoprim (BACTRIM DS,SEPTRA DS) 800-160 MG tablet Take 1 tablet by mouth 3 (three) times a week.    . cetirizine (ZYRTEC) 10 MG tablet Take 10 mg by mouth daily.    . phenylephrine (SUDAFED PE) 10 MG TABS tablet Take 10 mg by mouth daily.    . rizatriptan (MAXALT) 10 MG tablet Take 10 mg by mouth every 2 (two) hours as needed. May repeat in 2 hours if needed for migraines.     No facility-administered medications prior to visit.       Review of Systems  Constitutional: Negative for fever and unexpected weight change.  HENT: Positive for congestion. Negative for dental problem, ear pain, nosebleeds, postnasal drip, rhinorrhea, sinus pressure, sneezing, sore throat and trouble swallowing.   Eyes: Negative for redness and itching.  Respiratory: Positive for cough, chest tightness and shortness of breath. Negative for wheezing.   Cardiovascular: Negative for palpitations and leg swelling.  Gastrointestinal: Negative for nausea and vomiting.  Genitourinary: Negative for dysuria.  Musculoskeletal: Negative for joint swelling.  Skin: Negative for rash.  Neurological: Negative for headaches.  Hematological: Does not bruise/bleed easily.  Psychiatric/Behavioral: Negative for dysphoric mood. The patient is not nervous/anxious.         Objective:   Physical Exam Vitals:   08/16/16 0955  BP: 134/70  Pulse: 73  SpO2: 97%  Weight: 154 lb 6.4 oz (70 kg)  Height: 5' 0.5" (1.537 m)   3L [place] at rest  Gen: chronically ill appearing, no acute distress HENT: NCAT, OP clear, neck supple without masses Eyes: PERRL, EOMi Lymph: no cervical lymphadenopathy PULM: Crackles 1/2 way up bilaterally, no wheezing good air movement CV: RRR, slight systolic murmur,  no JVD GI: BS+, soft, nontender, no hsm Derm: no rash or skin breakdown, trace ankle edema MSK: normal bulk and tone Neuro: A&Ox4, CN II-XII intact, strength 5/5 in all 4 extremities Psyche: normal mood and affect    Records from my partners reviewed, she was last seen by [place] in 2014. She has a history of rheumatoid arthritis followed by Dr. Lenna Gilford and has a history of prior spontaneous pneumothorax and possible asthma.    Assessment & Plan:  Nonspecific interstitial pneumonitis, fibrosing type with organizing pneumonia She has a diagnosis of nonspecific interstitial pneumonitis fibrosing type with organizing pneumonia based on an open lung biopsy from September 2016. This is occurring in the setting of what appears to be an ill-defined underlying rheumatologic illness. Various rheumatology doctors have seen her in the past and suspicion has been raised for rheumatoid arthritis versus scleroderma. Apparently there is been no serologic evidence but on clinical grounds her current rheumatology team at the [facility] feels that she has systemic sclerosis. It's not entirely clear to me based on her clinical exam today or her symptoms that this is the case but I agree that there is heightened suspicion particular in the setting of a diagnosis of nonspecific interstitial pneumonitis.  Regardless the underlying cause of her interstitial lung disease this condition has progressed rapidly over the last several months. I agree completely with Dr. Janifer Adie recommendation that she go to the  [facility] interstitial lung disease clinic. More specifically, I think she is to be seen by the lung transplant team as soon as possible given the rapid progression of her illness.  We know from clinical trial data that the use of CellCept and prednisone is just as effective his other medicines for patients who have interstitial lung disease related to systemic sclerosis. Again, this diagnosis is not clear but appears to be the case at this time.  Plan: Continue CellCept and prednisone and Bactrim Refer to [facility] pulmonary, interstitial lung disease clinic and transplant clinic I will request records from the [place] system for pulmonary function testing, pathology report, clinic notes, and imaging  Chronic respiratory failure with hypoxia ([place]) Based on the rapidly worsening symptoms and oxygenation I worry that there may also be pulmonary hypertension contributing to her chronic respiratory fire with hypoxemia.  Plan: Check echocardiogram If evidence up on her hypertension then she will need to have a right heart catheterization    Current Outpatient Prescriptions:  .  albuterol (PROVENTIL HFA;VENTOLIN HFA) 108 (90 Base) MCG/ACT inhaler, Inhale 2 puffs into the lungs every 6 (six) hours as needed for wheezing or shortness of breath., Disp: , Rfl:  .  azelastine (ASTELIN) 0.1 % nasal spray, Place 2 sprays into both nostrils 2 (two) times daily. Use in each nostril as directed, Disp: , Rfl:  .  benzonatate (TESSALON) 100 MG capsule, Take 100 mg by mouth 3 (three) times daily as needed for cough., Disp: , Rfl:  .  calcium-vitamin D (OSCAL WITH D) 500-200 MG-UNIT tablet, Take 1 tablet by mouth., Disp: , Rfl:  .  chlorpheniramine (CHLOR-TRIMETON) 4 MG tablet, Take 4 mg by mouth at bedtime., Disp: , Rfl:  .  Cholecalciferol (SM VITAMIN D3) 4000 units CAPS, Take 1 capsule by mouth daily., Disp: , Rfl:  .  codeine 30 MG tablet, Take 10 mg by mouth every 6 (six) hours as needed (cough). , Disp: , Rfl:  .   dextromethorphan-guaiFENesin (MUCINEX DM) 30-600 MG 12hr tablet, Take 1 tablet by mouth 2 (two) times daily  as needed for cough., Disp: , Rfl:  .  estradiol (ESTRACE) 1 MG tablet, Take 1 mg by mouth daily., Disp: , Rfl:  .  FLUoxetine (PROZAC) 20 MG capsule, Take 40 mg by mouth 2 (two) times daily. , Disp: , Rfl:  .  fluticasone (FLONASE) 50 MCG/ACT nasal spray, Place 2 sprays into the nose daily., Disp: 16 g, Rfl: 2 .  gabapentin (NEURONTIN) 300 MG capsule, Take 300 mg by mouth 3 (three) times daily., Disp: , Rfl:  .  medroxyPROGESTERone (PROVERA) 2.5 MG tablet, Take 2.5 mg by mouth daily., Disp: , Rfl:  .  mycophenolate (CELLCEPT) 500 MG tablet, Take 1,000 mg by mouth 2 (two) times daily., Disp: , Rfl:  .  Olodaterol HCl (STRIVERDI RESPIMAT) 2.5 MCG/ACT AERS, Inhale 2 puffs into the lungs daily., Disp: , Rfl:  .  pantoprazole (PROTONIX) 20 MG tablet, Take 20 mg by mouth 2 (two) times daily., Disp: , Rfl:  .  predniSONE (DELTASONE) 20 MG tablet, Take 20 mg by mouth daily with breakfast., Disp: , Rfl:  .  sulfamethoxazole-trimethoprim (BACTRIM DS,SEPTRA DS) 800-160 MG tablet, Take 1 tablet by mouth 3 (three) times a week., Disp: , Rfl:

## 2016-08-16 NOTE — Assessment & Plan Note (Signed)
She has a diagnosis of nonspecific interstitial pneumonitis fibrosing type with organizing pneumonia based on an open lung biopsy from September 2016. This is occurring in the setting of what appears to be an ill-defined underlying rheumatologic illness. Various rheumatology doctors have seen her in the past and suspicion has been raised for rheumatoid arthritis versus scleroderma. Apparently there is been no serologic evidence but on clinical grounds her current rheumatology team at the [facility] feels that she has systemic sclerosis. It's not entirely clear to me based on her clinical exam today or her symptoms that this is the case but I agree that there is heightened suspicion particular in the setting of a diagnosis of nonspecific interstitial pneumonitis.  Regardless the underlying cause of her interstitial lung disease this condition has progressed rapidly over the last several months. I agree completely with Dr. Janifer Adie recommendation that she go to the [facility] interstitial lung disease clinic. More specifically, I think she is to be seen by the lung transplant team as soon as possible given the rapid progression of her illness.  We know from clinical trial data that the use of CellCept and prednisone is just as effective his other medicines for patients who have interstitial lung disease related to systemic sclerosis. Again, this diagnosis is not clear but appears to be the case at this time.  Plan: Continue CellCept and prednisone and Bactrim Refer to [facility] pulmonary, interstitial lung disease clinic and transplant clinic I will request records from the [place] system for pulmonary function testing, pathology report, clinic notes, and imaging

## 2016-08-16 NOTE — Patient Instructions (Signed)
I will reach out to Dr. Gwenette Greet to let him know I saw you Once you know which doctor at [facility] will be seeing please let me know and then I will reach out to them as well I will order an echocardiogram and then call you with the results We will see you back in one to 2 weeks ago over the results of the echocardiogram and make plans from there Keep taking her medicines as you're doing

## 2016-08-16 NOTE — Assessment & Plan Note (Signed)
Based on the rapidly worsening symptoms and oxygenation I worry that there may also be pulmonary hypertension contributing to her chronic respiratory fire with hypoxemia.  Plan: Check echocardiogram If evidence up on her hypertension then she will need to have a right heart catheterization

## 2016-08-17 ENCOUNTER — Encounter (HOSPITAL_COMMUNITY)
Admission: RE | Admit: 2016-08-17 | Discharge: 2016-08-17 | Disposition: A | Discharge status: Home / Self Care | Payer: No Typology Code available for payment source | Source: Ambulatory Visit | Attending: Pulmonary Disease | Admitting: Pulmonary Disease

## 2016-08-17 VITALS — Wt 154.8 lb

## 2016-08-17 DIAGNOSIS — J8410 Pulmonary fibrosis, unspecified: Secondary | ICD-10-CM | POA: Diagnosis not present

## 2016-08-17 NOTE — Progress Notes (Signed)
Daily Session Note  Patient Details  Name: Olivia [name] MRN: [id number] Date of Birth: November 27, 1965 Referring Provider:   April Manson Pulmonary Rehab Walk Test from 05/11/2016 in [facility]  Referring Provider  Dr. Gwenette Greet      Encounter Date: 08/17/2016  Check In:     Session Check In - 08/17/16 1215      Check-In   Location [facility]   Staff Present Rosebud Poles, RN, BSN;Molly diVincenzo, MS, ACSM RCEP, Exercise Physiologist;Islay Polanco Ysidro Evert, RN;Portia Rollene Rotunda, RN, BSN   Supervising physician immediately available to respond to emergencies [facility] immediately available   Physician(s) Dr. Alfredia Ferguson   Medication changes reported     No   Fall or balance concerns reported    No   Warm-up and Cool-down Performed as group-led instruction   Resistance Training Performed Yes   VAD Patient? No     Pain Assessment   Currently in Pain? No/denies   Multiple Pain Sites No      Capillary Blood Glucose: No results found for this or any previous visit (from the past 24 hour(s)).      Exercise Prescription Changes - 08/17/16 1200      Exercise Review   Progression Yes     Response to Exercise   Blood Pressure (Admit) 98/60   Blood Pressure (Exercise) 124/70   Blood Pressure (Exit) 118/62   Heart Rate (Admit) 91 bpm   Heart Rate (Exercise) 106 bpm   Heart Rate (Exit) 89 bpm   Oxygen Saturation (Admit) 94 %   Oxygen Saturation (Exercise) 93 %   Oxygen Saturation (Exit) 98 %   Rating of Perceived Exertion (Exercise) 13   Perceived Dyspnea (Exercise) 1.5   Duration Progress to 45 minutes of aerobic exercise without signs/symptoms of physical distress   Intensity THRR unchanged     Progression   Progression Continue to progress workloads to maintain intensity without signs/symptoms of physical distress.     Resistance Training   Training Prescription Yes   Weight orange bands   Reps 10-12  10 minutes of strength training     Oxygen   Oxygen Continuous   Liters 6     NuStep   Level 6   Minutes 17   METs 2.9     Arm Ergometer   Level 4.6   Minutes 17     Goals Met:  Exercise tolerated well No report of cardiac concerns or symptoms Strength training completed today  Goals Unmet:  Not Applicable  Comments: Service time is from 1030 to 1210    Dr. Rush Farmer is Medical Director for Pulmonary Rehab at [facility].

## 2016-08-18 ENCOUNTER — Telehealth: Payer: Self-pay | Admitting: Pulmonary Disease

## 2016-08-18 NOTE — Telephone Encounter (Signed)
Spoke with pt. States that she had the echo done today that the [facility] [place] in [place]. Reports that the results will be sent to Dr. Gwenette Greet. She will call Dr. Gwenette Greet and have him fax the results to our office. Will route message to BQ to make him aware.

## 2016-08-20 NOTE — Telephone Encounter (Signed)
noted 

## 2016-08-22 ENCOUNTER — Encounter (HOSPITAL_COMMUNITY)
Admission: RE | Admit: 2016-08-22 | Discharge: 2016-08-22 | Disposition: A | Discharge status: Home / Self Care | Payer: No Typology Code available for payment source | Source: Ambulatory Visit | Attending: Pulmonary Disease | Admitting: Pulmonary Disease

## 2016-08-22 VITALS — Wt 155.9 lb

## 2016-08-22 DIAGNOSIS — J8410 Pulmonary fibrosis, unspecified: Secondary | ICD-10-CM

## 2016-08-22 NOTE — Progress Notes (Signed)
Daily Session Note  Patient Details  Name: Olivia [name] MRN: [id number] Date of Birth: 04/17/1966 Referring Provider:   Flowsheet Row Pulmonary Rehab Walk Test from 05/11/2016 in [facility]  Referring Provider  Dr. Clance      Encounter Date: 08/22/2016  Check In:     Session Check In - 08/22/16 1030      Check-In   Location [facility]   Staff Present Joan Behrens, RN, BSN; , MS, ACSM RCEP, Exercise Physiologist;Annedrea Stackhouse, RN, MHA;Portia Payne, RN, BSN   Supervising physician immediately available to respond to emergencies [facility] immediately available   Physician(s) Dr. Sheikh   Medication changes reported     No   Fall or balance concerns reported    No   Warm-up and Cool-down Performed as group-led instruction   Resistance Training Performed Yes   VAD Patient? No     Pain Assessment   Currently in Pain? No/denies   Multiple Pain Sites No      Capillary Blood Glucose: No results found for this or any previous visit (from the past 24 hour(s)).      Exercise Prescription Changes - 08/22/16 1200      Exercise Review   Progression Yes     Response to Exercise   Blood Pressure (Admit) 90/56   Blood Pressure (Exercise) 136/70   Blood Pressure (Exit) 104/60   Heart Rate (Admit) 98 bpm   Heart Rate (Exercise) 124 bpm   Heart Rate (Exit) 82 bpm   Oxygen Saturation (Admit) 97 %   Oxygen Saturation (Exercise) 89 %   Oxygen Saturation (Exit) 98 %   Rating of Perceived Exertion (Exercise) 13   Perceived Dyspnea (Exercise) 2   Duration Progress to 45 minutes of aerobic exercise without signs/symptoms of physical distress   Intensity THRR unchanged     Progression   Progression Continue to progress workloads to maintain intensity without signs/symptoms of physical distress.     Resistance Training   Training Prescription Yes   Weight orange bands   Reps 10-12  10 minutes of strength  training     Oxygen   Oxygen Continuous   Liters 6     NuStep   Level 6   Minutes 17   METs 2.3     Arm Ergometer   Level 5   Minutes 17     Track   Laps 18   Minutes 17     Goals Met:  Exercise tolerated well No report of cardiac concerns or symptoms Strength training completed today  Goals Unmet:  Not Applicable  Comments: Service time is from 10:30AM to 12:05PM    Dr. Wesam G. Yacoub is Medical Director for Pulmonary Rehab at [facility]. 

## 2016-08-24 ENCOUNTER — Telehealth: Payer: Self-pay | Admitting: Pulmonary Disease

## 2016-08-24 ENCOUNTER — Encounter (HOSPITAL_COMMUNITY)
Admission: RE | Admit: 2016-08-24 | Discharge: 2016-08-24 | Disposition: A | Discharge status: Home / Self Care | Payer: No Typology Code available for payment source | Source: Ambulatory Visit | Attending: Pulmonary Disease | Admitting: Pulmonary Disease

## 2016-08-24 VITALS — Wt 156.1 lb

## 2016-08-24 DIAGNOSIS — J8410 Pulmonary fibrosis, unspecified: Secondary | ICD-10-CM | POA: Diagnosis not present

## 2016-08-24 NOTE — Telephone Encounter (Signed)
Called and spoke to pt. Pt states she already had her echo at the [place]. Echo results were mailed to our office and are in BQ's look-at's. Appt has been scheduled to see TP on 10.27.17. Pt verbalized understanding and denied any further questions or concerns at this time.    Will send to BQ as FYI. Echo results are in look-at's.

## 2016-08-24 NOTE — Progress Notes (Signed)
Daily Session Note  Patient Details  Name: Olivia [name] MRN: [id number] Date of Birth: 06-03-1966 Referring Provider:   April Manson [facility] Walk Test from 05/11/2016 in [place]  Referring Provider  Dr. Gwenette Greet      Encounter Date: 08/24/2016  Check In:     Session Check In - 08/24/16 1030      Check-In   Location [facility]   Staff Present Rosebud Poles, RN, BSN;Edin Kon, MS, ACSM RCEP, Exercise Physiologist;Annedrea Rosezella [place], RN, MHA;Portia Rollene Rotunda, RN, BSN   Supervising physician immediately available to respond to emergencies [facility] immediately available   Physician(s) Dr. Rockne Menghini   Medication changes reported     No   Fall or balance concerns reported    No   Warm-up and Cool-down Performed as group-led instruction   Resistance Training Performed Yes   VAD Patient? No     Pain Assessment   Currently in Pain? No/denies   Multiple Pain Sites No      Capillary Blood Glucose: No results found for this or any previous visit (from the past 24 hour(s)).      Exercise Prescription Changes - 08/24/16 1200      Response to Exercise   Blood Pressure (Admit) 110/76   Blood Pressure (Exercise) 120/70   Blood Pressure (Exit) 122/50   Heart Rate (Admit) 95 bpm   Heart Rate (Exercise) 112 bpm   Heart Rate (Exit) 101 bpm   Oxygen Saturation (Admit) 93 %   Oxygen Saturation (Exercise) 96 %   Oxygen Saturation (Exit) 97 %   Rating of Perceived Exertion (Exercise) 12   Perceived Dyspnea (Exercise) 2   Duration Progress to 45 minutes of aerobic exercise without signs/symptoms of physical distress   Intensity THRR unchanged     Progression   Progression Continue to progress workloads to maintain intensity without signs/symptoms of physical distress.     Resistance Training   Training Prescription Yes   Weight orange bands   Reps 10-12  10 minutes of strength training     Oxygen   Oxygen  Continuous   Liters 6     Treadmill   MPH 2.3   Grade 3   Minutes 17     Goals Met:  Exercise tolerated well No report of cardiac concerns or symptoms Strength training completed today  Goals Unmet:  O2 Sat  Comments: Service time is from 10:30am to 12:00pm    Dr. Rush Farmer is Medical Director for Pulmonary Rehab at [facility].

## 2016-08-29 ENCOUNTER — Encounter (HOSPITAL_COMMUNITY): Payer: No Typology Code available for payment source

## 2016-08-31 ENCOUNTER — Encounter (HOSPITAL_COMMUNITY): Payer: No Typology Code available for payment source

## 2016-08-31 ENCOUNTER — Other Ambulatory Visit (HOSPITAL_COMMUNITY): Payer: Managed Care, Other (non HMO)

## 2016-09-01 ENCOUNTER — Encounter: Payer: Self-pay | Admitting: Adult Health

## 2016-09-01 ENCOUNTER — Ambulatory Visit (INDEPENDENT_AMBULATORY_CARE_PROVIDER_SITE_OTHER): Payer: Managed Care, Other (non HMO) | Admitting: Adult Health

## 2016-09-01 ENCOUNTER — Ambulatory Visit: Payer: Managed Care, Other (non HMO) | Admitting: Adult Health

## 2016-09-01 DIAGNOSIS — J849 Interstitial pulmonary disease, unspecified: Secondary | ICD-10-CM | POA: Diagnosis not present

## 2016-09-01 DIAGNOSIS — J9611 Chronic respiratory failure with hypoxia: Secondary | ICD-10-CM

## 2016-09-01 NOTE — Patient Instructions (Signed)
Follow up with [facility] as planned .  Continue on current regimen  [facility]  Continue on Oxygen , goal to keep O2 sat >90%.  Follow up with our office As needed

## 2016-09-01 NOTE — Progress Notes (Signed)
Subjective:    Patient ID: Olivia [name], female    DOB: 08/06/1966, 50 y.o.   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 His underlying ill-defined rheumatological illness ? Systemic sclerosis .   09/01/2016  Patient returns for a two-week follow-up. Patient was seen last visit, her second opinion. She's had progressive dyspnea on exertion and progression of her interstitial lung disease. She remains on CellCept, prednisone and Bactrim. He is followed at the [facility] center. She was set up for a Repeat echo that showed  preserved EF , PAP 30.  She has been approved through the [place] to see ILD clinic at [facility] . She awaiting for appointment time .  She denies any chest pain, fever, hemoptysis, or increased extremity edema.   Past Medical History:  Diagnosis Date  . Adenomatous colon polyp   . Anxiety and depression   . Asthma   . Cirrhosis ([place])   . Colon polyps   . GERD (gastroesophageal reflux disease)   . Hyperlipidemia   . IBS (irritable bowel syndrome)   . Iron deficiency anemia   . Migraine headache   . MVP (mitral valve prolapse)   . NSIP (nonspecific interstitial pneumonia) ([place])   . Pulmonary fibrosis, unspecified ([place])   . Spontaneous pneumothorax    Current Outpatient Prescriptions on File Prior to Visit  Medication Sig Dispense Refill  . albuterol (PROVENTIL HFA;VENTOLIN HFA) 108 (90 Base) MCG/ACT inhaler Inhale 2 puffs into the lungs every 6 (six) hours as needed for wheezing or shortness of breath.    Marland Kitchen azelastine (ASTELIN) 0.1 % nasal spray Place 2 sprays into both nostrils 2 (two) times daily. Use in each nostril as directed    . benzonatate (TESSALON) 100 MG capsule Take 100 mg by mouth 3 (three) times daily as needed for cough.    . calcium-vitamin D (OSCAL WITH D) 500-200 MG-UNIT tablet Take 1 tablet by mouth.    . chlorpheniramine  (CHLOR-TRIMETON) 4 MG tablet Take 4 mg by mouth at bedtime.    . Cholecalciferol (SM VITAMIN D3) 4000 units CAPS Take 1 capsule by mouth daily.    . codeine 30 MG tablet Take 10 mg by mouth every 6 (six) hours as needed (cough).     Marland Kitchen dextromethorphan-guaiFENesin (MUCINEX DM) 30-600 MG 12hr tablet Take 1 tablet by mouth 2 (two) times daily as needed for cough.    . estradiol (ESTRACE) 1 MG tablet Take 1 mg by mouth daily.    Marland Kitchen FLUoxetine (PROZAC) 20 MG capsule Take 40 mg by mouth 2 (two) times daily.     . fluticasone (FLONASE) 50 MCG/ACT nasal spray Place 2 sprays into the nose daily. 16 g 2  . gabapentin (NEURONTIN) 300 MG capsule Take 300 mg by mouth 3 (three) times daily.    . medroxyPROGESTERone (PROVERA) 2.5 MG tablet Take 2.5 mg by mouth daily.    . mycophenolate (CELLCEPT) 500 MG tablet Take 1,000 mg by mouth 2 (two) times daily.    . Olodaterol HCl (STRIVERDI RESPIMAT) 2.5 MCG/ACT AERS Inhale 2 puffs into the lungs daily.    . pantoprazole (PROTONIX) 20 MG tablet Take 20 mg by mouth 2 (two) times daily.    . predniSONE (DELTASONE) 20 MG tablet Take 20 mg by mouth daily with breakfast.    . sulfamethoxazole-trimethoprim (BACTRIM DS,SEPTRA DS) 800-160 MG tablet Take 1 tablet by mouth 3 (three) times  a week.     No current facility-administered medications on file prior to visit.       Review of Systems Constitutional:   No  weight loss, night sweats,  Fevers, chills,  +fatigue, or  lassitude.  HEENT:   No headaches,  Difficulty swallowing,  Tooth/dental problems, or  Sore throat,                No sneezing, itching, ear ache, nasal congestion, post nasal drip,   CV:  No chest pain,  Orthopnea, PND,   anasarca, dizziness, palpitations, syncope.   GI  No heartburn, indigestion, abdominal pain, nausea, vomiting, diarrhea, change in bowel habits, loss of appetite, bloody stools.   Resp:  .  No chest wall deformity  Skin: no rash or lesions.  GU: no dysuria, change in color of  urine, no urgency or frequency.  No flank pain, no hematuria   MS:  No joint pain or swelling.  No decreased range of motion.  No back pain.  Psych:  No change in mood or affect. No depression or anxiety.  No memory loss.         Objective:   Physical Exam . Vitals:   09/01/16 1634  BP: 118/60  Pulse: 79  Temp: 97.8 F (36.6 C)  TempSrc: Oral  SpO2: 100%  Weight: 156 lb (70.8 kg)  Height: 5\' 1"  (1.549 m)   GEN: A/Ox3; pleasant , NAD, chronically ill appearing    HEENT:  [place]/AT,  EACs-clear, TMs-wnl, NOSE-clear, THROAT-clear, no lesions, no postnasal drip or exudate noted.   NECK:  Supple w/ fair ROM; no JVD; normal carotid impulses w/o bruits; no thyromegaly or nodules palpated; no lymphadenopathy.    RESP  BB crackles . no accessory muscle use, no dullness to percussion  CARD:  RRR, no m/r/g  , tr peripheral edema, pulses intact, no cyanosis or clubbing.  GI:   Soft & nt; nml bowel sounds; no organomegaly or masses detected.   Musco: Warm bil, no deformities or joint swelling noted.   Neuro: alert, no focal deficits noted.    Skin: Warm, no lesions or rashes  Ezella Kell NP-C  [facility]  09/01/2016        Assessment & Plan:

## 2016-09-05 ENCOUNTER — Encounter (HOSPITAL_COMMUNITY): Admission: RE | Admit: 2016-09-05 | Payer: No Typology Code available for payment source | Source: Ambulatory Visit

## 2016-09-07 ENCOUNTER — Encounter (HOSPITAL_COMMUNITY): Payer: No Typology Code available for payment source

## 2016-09-07 NOTE — Assessment & Plan Note (Signed)
Patient Instructions  Follow up with [facility] as planned .  Continue on current regimen  [facility]  Continue on Oxygen , goal to keep O2 sat >90%.  Follow up with our office As needed

## 2016-09-12 ENCOUNTER — Encounter (HOSPITAL_COMMUNITY)
Admission: RE | Admit: 2016-09-12 | Discharge: 2016-09-12 | Disposition: A | Discharge status: Home / Self Care | Payer: No Typology Code available for payment source | Source: Ambulatory Visit | Attending: Pulmonary Disease | Admitting: Pulmonary Disease

## 2016-09-12 DIAGNOSIS — K219 Gastro-esophageal reflux disease without esophagitis: Secondary | ICD-10-CM | POA: Insufficient documentation

## 2016-09-12 DIAGNOSIS — F329 Major depressive disorder, single episode, unspecified: Secondary | ICD-10-CM | POA: Insufficient documentation

## 2016-09-12 DIAGNOSIS — J8410 Pulmonary fibrosis, unspecified: Secondary | ICD-10-CM | POA: Insufficient documentation

## 2016-09-12 DIAGNOSIS — Z7951 Long term (current) use of inhaled steroids: Secondary | ICD-10-CM | POA: Insufficient documentation

## 2016-09-12 DIAGNOSIS — J45909 Unspecified asthma, uncomplicated: Secondary | ICD-10-CM | POA: Insufficient documentation

## 2016-09-12 DIAGNOSIS — Z87891 Personal history of nicotine dependence: Secondary | ICD-10-CM | POA: Insufficient documentation

## 2016-09-12 DIAGNOSIS — E785 Hyperlipidemia, unspecified: Secondary | ICD-10-CM | POA: Insufficient documentation

## 2016-09-12 DIAGNOSIS — F419 Anxiety disorder, unspecified: Secondary | ICD-10-CM | POA: Insufficient documentation

## 2016-09-12 DIAGNOSIS — Z79899 Other long term (current) drug therapy: Secondary | ICD-10-CM | POA: Insufficient documentation

## 2016-09-12 DIAGNOSIS — Z7952 Long term (current) use of systemic steroids: Secondary | ICD-10-CM | POA: Insufficient documentation

## 2016-09-12 DIAGNOSIS — K7460 Unspecified cirrhosis of liver: Secondary | ICD-10-CM | POA: Insufficient documentation

## 2016-09-12 NOTE — Progress Notes (Signed)
Pulmonary Individual Treatment Plan  Patient Details  Name: Olivia [name] MRN: [id number] Date of Birth: Jul 21, 1966 Referring Provider:   April Manson Pulmonary Rehab Walk Test from 05/11/2016 in [place]  Referring Provider  Dr. Gwenette Greet      Initial Encounter Date:  Flowsheet Row Pulmonary Rehab Walk Test from 05/11/2016 in [place]  Date  05/11/16  Referring Provider  Dr. Gwenette Greet      Visit Diagnosis: Pulmonary fibrosis, unspecified ([place])  Patient's Home Medications on Admission:   Current Outpatient Prescriptions:  .  albuterol (PROVENTIL HFA;VENTOLIN HFA) 108 (90 Base) MCG/ACT inhaler, Inhale 2 puffs into the lungs every 6 (six) hours as needed for wheezing or shortness of breath., Disp: , Rfl:  .  azelastine (ASTELIN) 0.1 % nasal spray, Place 2 sprays into both nostrils 2 (two) times daily. Use in each nostril as directed, Disp: , Rfl:  .  benzonatate (TESSALON) 100 MG capsule, Take 100 mg by mouth 3 (three) times daily as needed for cough., Disp: , Rfl:  .  calcium-vitamin D (OSCAL WITH D) 500-200 MG-UNIT tablet, Take 1 tablet by mouth., Disp: , Rfl:  .  chlorpheniramine (CHLOR-TRIMETON) 4 MG tablet, Take 4 mg by mouth at bedtime., Disp: , Rfl:  .  Cholecalciferol (SM VITAMIN D3) 4000 units CAPS, Take 1 capsule by mouth daily., Disp: , Rfl:  .  codeine 30 MG tablet, Take 10 mg by mouth every 6 (six) hours as needed (cough). , Disp: , Rfl:  .  dextromethorphan-guaiFENesin (MUCINEX DM) 30-600 MG 12hr tablet, Take 1 tablet by mouth 2 (two) times daily as needed for cough., Disp: , Rfl:  .  estradiol (ESTRACE) 1 MG tablet, Take 1 mg by mouth daily., Disp: , Rfl:  .  FLUoxetine (PROZAC) 20 MG capsule, Take 40 mg by mouth 2 (two) times daily. , Disp: , Rfl:  .  fluticasone (FLONASE) 50 MCG/ACT nasal spray, Place 2 sprays into the nose daily., Disp: 16 g, Rfl: 2 .  gabapentin (NEURONTIN) 300 MG capsule, Take 300 mg by mouth  3 (three) times daily., Disp: , Rfl:  .  medroxyPROGESTERone (PROVERA) 2.5 MG tablet, Take 2.5 mg by mouth daily., Disp: , Rfl:  .  mycophenolate (CELLCEPT) 500 MG tablet, Take 1,000 mg by mouth 2 (two) times daily., Disp: , Rfl:  .  Olodaterol HCl (STRIVERDI RESPIMAT) 2.5 MCG/ACT AERS, Inhale 2 puffs into the lungs daily., Disp: , Rfl:  .  pantoprazole (PROTONIX) 20 MG tablet, Take 20 mg by mouth 2 (two) times daily., Disp: , Rfl:  .  predniSONE (DELTASONE) 20 MG tablet, Take 20 mg by mouth daily with breakfast., Disp: , Rfl:  .  sulfamethoxazole-trimethoprim (BACTRIM DS,SEPTRA DS) 800-160 MG tablet, Take 1 tablet by mouth 3 (three) times a week., Disp: , Rfl:   Past Medical History: Past Medical History:  Diagnosis Date  . Adenomatous colon polyp   . Anxiety and depression   . Asthma   . Cirrhosis ([place])   . Colon polyps   . GERD (gastroesophageal reflux disease)   . Hyperlipidemia   . IBS (irritable bowel syndrome)   . Iron deficiency anemia   . Migraine headache   . MVP (mitral valve prolapse)   . NSIP (nonspecific interstitial pneumonia) ([place])   . Pulmonary fibrosis, unspecified ([place])   . Spontaneous pneumothorax     Tobacco Use: History  Smoking Status  . Former Smoker  . Packs/day: 0.50  . Years:  20.00  . Types: Cigarettes  . Quit date: 11/06/1998  Smokeless Tobacco  . Never Used    Labs: Recent Review Flowsheet Data    Labs for ITP Cardiac and Pulmonary Rehab Latest Ref Rng & Units 02/01/2010   Cholestrol 0 - 200 mg/dL 205(H)   LDLDIRECT mg/dL 139.1   HDL >39.00 mg/dL 63.10   Trlycerides 0.0 - 149.0 mg/dL 107.0      Capillary Blood Glucose: No results found for: GLUCAP   ADL UCSD:     Pulmonary Assessment Scores    Row Name 05/17/16 1549         ADL UCSD   ADL Phase Entry     SOB Score total 67        Pulmonary Function Assessment:   Exercise Target Goals:    Exercise Program Goal: Individual exercise prescription set with THRR, safety &  activity barriers. Participant demonstrates ability to understand and report RPE using BORG scale, to self-measure pulse accurately, and to acknowledge the importance of the exercise prescription.  Exercise Prescription Goal: Starting with aerobic activity 30 plus minutes a day, 3 days per week for initial exercise prescription. Provide home exercise prescription and guidelines that participant acknowledges understanding prior to discharge.  Activity Barriers & Risk Stratification:     Activity Barriers & Cardiac Risk Stratification - 05/08/16 1123      Activity Barriers & Cardiac Risk Stratification   Activity Barriers Deconditioning;Shortness of Breath;Joint Problems      6 Minute Walk:     6 Minute Walk    Row Name 05/11/16 1638         6 Minute Walk   Phase Initial     Distance 1005 feet     Walk Time 6 minutes     # of Rest Breaks 0     MPH 1.9     METS 2.45     RPE 11     Perceived Dyspnea  1     Symptoms No     Resting HR 103 bpm     Resting BP 122/70     Max Ex. HR 119 bpm     Max Ex. BP 110/70       Interval HR   Baseline HR 103     1 Minute HR 112     2 Minute HR 119     3 Minute HR 118     4 Minute HR 115     5 Minute HR 116     6 Minute HR 101     2 Minute Post HR 96     Interval Heart Rate? Yes       Interval Oxygen   Interval Oxygen? Yes     Baseline Oxygen Saturation % 103 %     Baseline Liters of Oxygen 3 L     1 Minute Oxygen Saturation % 91 %     1 Minute Liters of Oxygen 3 L     2 Minute Oxygen Saturation % 86 %     2 Minute Liters of Oxygen 3 L     3 Minute Oxygen Saturation % 90 %     3 Minute Liters of Oxygen 4 L     4 Minute Oxygen Saturation % 88 %     4 Minute Liters of Oxygen 6 L     5 Minute Oxygen Saturation % 90 %     5 Minute Liters of Oxygen 6 L  6 Minute Oxygen Saturation % 98 %     6 Minute Liters of Oxygen 6 L     2 Minute Post Oxygen Saturation % 98 %     2 Minute Post Liters of Oxygen 6 L        Initial  Exercise Prescription:     Initial Exercise Prescription - 05/11/16 1600      Date of Initial Exercise RX and Referring Provider   Date 05/11/16   Referring Provider Dr. Gwenette Greet     NuStep   Level 1   Minutes 17   METs 1.7     Arm Ergometer   Level 1   Minutes 17     Track   Laps 5   Minutes 17     Prescription Details   Frequency (times per week) 2   Duration Progress to 45 minutes of aerobic exercise without signs/symptoms of physical distress     Intensity   THRR 40-80% of Max Heartrate 68-135   Perceived Dyspnea 0-4     Progression   Progression Continue progressive overload as per policy without signs/symptoms or physical distress.     Resistance Training   Training Prescription Yes   Weight orange bands   Reps 10-12      Perform Capillary Blood Glucose checks as needed.  Exercise Prescription Changes:     Exercise Prescription Changes    Row Name 05/18/16 1200 05/23/16 1200 05/25/16 1300 06/06/16 1200 06/13/16 1200     Exercise Review   Progression  -  - Yes  -  -     Response to Exercise   Blood Pressure (Admit) 134/70 118/70 116/70 104/70 110/64   Blood Pressure (Exercise) 130/76 132/80 120/66 120/70 104/60   Blood Pressure (Exit) 110/60 104/60 100/64 100/60 114/60   Heart Rate (Admit) 85 bpm 98 bpm 75 bpm 104 bpm 92 bpm   Heart Rate (Exercise) 86 bpm 118 bpm 113 bpm 108 bpm 98 bpm   Heart Rate (Exit) 86 bpm 83 bpm 83 bpm 81 bpm 80 bpm   Oxygen Saturation (Admit) 99 % 90 % 95 % 89 % 97 %   Oxygen Saturation (Exercise) 98 % 91 % 91 % 94 % 95 %   Oxygen Saturation (Exit) 99 % 98 % 97 % 98 % 100 %   Rating of Perceived Exertion (Exercise) 13 11 11 13 13    Perceived Dyspnea (Exercise) 2 1 1 2 3    Duration Progress to 45 minutes of aerobic exercise without signs/symptoms of physical distress Progress to 45 minutes of aerobic exercise without signs/symptoms of physical distress Progress to 45 minutes of aerobic exercise without signs/symptoms of  physical distress Progress to 45 minutes of aerobic exercise without signs/symptoms of physical distress Progress to 45 minutes of aerobic exercise without signs/symptoms of physical distress   Intensity THRR unchanged THRR unchanged THRR unchanged THRR unchanged THRR unchanged     Progression   Progression Continue to progress workloads to maintain intensity without signs/symptoms of physical distress. Continue to progress workloads to maintain intensity without signs/symptoms of physical distress. Continue to progress workloads to maintain intensity without signs/symptoms of physical distress. Continue to progress workloads to maintain intensity without signs/symptoms of physical distress. Continue to progress workloads to maintain intensity without signs/symptoms of physical distress.     Resistance Training   Training Prescription Yes Yes Yes Yes Yes   Weight orange bands orange bands orange bands orange bands orange bands   Reps 10-12 10-12 10-12  10 minutes 10-12  10 minutes 10-12  10 minutes of strength training     Interval Training   Interval Training No No No No No     Oxygen   Oxygen Continuous Continuous Continuous Continuous Continuous   Liters 6 6 6 6 6      NuStep   Level 1 1 2 2 2    Minutes 17 17 17 17 17    METs 1.4 1.7 1.8 2.8 1.7     Arm Ergometer   Level 1 1  - 1 1   Minutes 17 17  - 81 17     Track   Laps  - 12 14 14 14    Minutes  - 17 17 17 17    Row Name 06/20/16 1200 06/22/16 1200 07/06/16 1200 07/11/16 1200 07/13/16 1228     Exercise Review   Progression Yes  -  -  - Yes     Response to Exercise   Blood Pressure (Admit) 98/60 98/50 102/70 94/80 104/60   Blood Pressure (Exercise) 118/64 104/60 106/66 110/64 124/66   Blood Pressure (Exit) 96/60 102/66 100/60 92/62 120/82   Heart Rate (Admit) 91 bpm 98 bpm 73 bpm 99 bpm 94 bpm   Heart Rate (Exercise) 110 bpm 103 bpm 85 bpm 112 bpm 83 bpm   Heart Rate (Exit) 90 bpm 82 bpm 75 bpm 86 bpm 92 bpm   Oxygen  Saturation (Admit) 96 % 93 % 98 % 90 % 95 %   Oxygen Saturation (Exercise) 93 % 94 % 97 % 97 % 99 %   Oxygen Saturation (Exit) 100 % 97 % 99 % 100 % 92 %   Rating of Perceived Exertion (Exercise) 13 11 12 15 11    Perceived Dyspnea (Exercise) 2 3 1 2 1    Duration Progress to 45 minutes of aerobic exercise without signs/symptoms of physical distress Progress to 45 minutes of aerobic exercise without signs/symptoms of physical distress Progress to 45 minutes of aerobic exercise without signs/symptoms of physical distress Progress to 45 minutes of aerobic exercise without signs/symptoms of physical distress Progress to 45 minutes of aerobic exercise without signs/symptoms of physical distress   Intensity THRR unchanged THRR unchanged THRR unchanged THRR unchanged THRR unchanged     Progression   Progression Continue to progress workloads to maintain intensity without signs/symptoms of physical distress. Continue to progress workloads to maintain intensity without signs/symptoms of physical distress. Continue to progress workloads to maintain intensity without signs/symptoms of physical distress. Continue to progress workloads to maintain intensity without signs/symptoms of physical distress. Continue to progress workloads to maintain intensity without signs/symptoms of physical distress.     Resistance Training   Training Prescription Yes Yes Yes Yes Yes   Weight orange bands orange bands orange bands orange bands orange bands   Reps 10-12  10 minutes of strength training 10-12  10 minutes of strength training 10-12  10 minutes of strength training 10-12  10 minutes of strength training 10-12  10 minutes of strength training     Interval Training   Interval Training No No No No No     Oxygen   Oxygen Continuous Continuous Continuous Continuous Continuous   Liters 6  -  -  -  -     NuStep   Level  - 2  - 2 3   Minutes  - 17  - 17 17   METs  - 2.3  - 1.9 2.2     Arm Ergometer   Level  -  2  2 2  -   Minutes  - 17 17 17  -     Track   Laps  -  - 12 18 -   [place] to continue exercise at [facility] 3 additional days to program exercise sessions.  -  -  -  -   Row Name 07/18/16 1200 07/20/16 1200 07/25/16 1241 07/27/16 1300 08/03/16 1300     Exercise Review   Progression  - Yes  -  - Yes     Response to Exercise   Blood Pressure (Admit) 102/68 94/54 100/72 100/60 90/50   Blood Pressure (Exercise) 108/62 108/64 108/52  - 100/60   Blood Pressure (Exit) 112/68 100/60 92/60 101/71 96/50   Heart Rate (Admit) 92 bpm 94 bpm 94 bpm 104 bpm 92 bpm   Heart Rate (Exercise) 113 bpm 118 bpm 112 bpm  - 100 bpm   Heart Rate (Exit) 97 bpm 96 bpm 90 bpm 95 bpm 83 bpm   Oxygen Saturation (Admit) 92 % 92 % 98 % 96 % 95 %   Oxygen Saturation (Exercise) 92 % 91 % 96 %  - 94 %   Oxygen Saturation (Exit) 98 % 96 % 99 % 96 % 99 %   Rating of Perceived Exertion (Exercise) 13 13 13   - 12   Perceived Dyspnea (Exercise) 2 3 2   - 1.5   Duration Progress to 45 minutes of aerobic exercise without signs/symptoms of physical distress Progress to 45 minutes of aerobic exercise without signs/symptoms of physical distress Progress to 45 minutes of aerobic exercise without signs/symptoms of physical distress  - Progress to 45 minutes of aerobic exercise without signs/symptoms of physical distress   Intensity THRR unchanged THRR unchanged THRR unchanged  - THRR unchanged     Progression   Progression Continue to progress workloads to maintain intensity without signs/symptoms of physical distress. Continue to progress workloads to maintain intensity without signs/symptoms of physical distress. Continue to progress workloads to maintain intensity without signs/symptoms of physical distress.  - Continue to progress workloads to maintain intensity without signs/symptoms of physical distress.     Resistance Training   Training Prescription Yes Yes  Yes  - Yes   Weight orange bands orange bands orange bands  - orange bands   Reps 10-12  10 minutes of strength training 10-12  10 minutes of strength training 10-12  10 minutes of strength training  - 10-12  10 minutes of strength training     Interval Training   Interval Training No No No  - No     Oxygen   Oxygen Continuous Continuous Continuous Continuous Continuous   Liters  - 6 6 6 6      NuStep   Level 3  - 5  - 6   Minutes 17  - 17  - 17   METs 2.3  - 2.4  - 2.5     Arm Ergometer   Level 2 3 3   - 3   Minutes 17 17 17   - 17     Track   Laps 15 18 15 3   -   Minutes 17 17 17 5   -   Row Name 08/08/16 1223 08/17/16 1200 08/22/16 1200 08/24/16 1200       Exercise Review   Progression  - Yes Yes  -  Response to Exercise   Blood Pressure (Admit) 110/62 98/60 90/56  110/76    Blood Pressure (Exercise) 110/68 124/70 136/70 120/70    Blood Pressure (Exit) 96/54 118/62 104/60 122/50    Heart Rate (Admit) 83 bpm 91 bpm 98 bpm 95 bpm    Heart Rate (Exercise) 102 bpm 106 bpm 124 bpm 112 bpm    Heart Rate (Exit) 73 bpm 89 bpm 82 bpm 101 bpm    Oxygen Saturation (Admit) 91 % 94 % 97 % 93 %    Oxygen Saturation (Exercise) 87 % 93 % 89 % 96 %    Oxygen Saturation (Exit) 99 % 98 % 98 % 97 %    Rating of Perceived Exertion (Exercise) 12 13 13 12     Perceived Dyspnea (Exercise) 1.5 1.5 2 2     Duration Progress to 45 minutes of aerobic exercise without signs/symptoms of physical distress Progress to 45 minutes of aerobic exercise without signs/symptoms of physical distress Progress to 45 minutes of aerobic exercise without signs/symptoms of physical distress Progress to 45 minutes of aerobic exercise without signs/symptoms of physical distress    Intensity THRR unchanged THRR unchanged THRR unchanged THRR unchanged      Progression   Progression Continue to progress workloads to maintain intensity without signs/symptoms of physical distress. Continue to progress workloads to  maintain intensity without signs/symptoms of physical distress. Continue to progress workloads to maintain intensity without signs/symptoms of physical distress. Continue to progress workloads to maintain intensity without signs/symptoms of physical distress.      Resistance Training   Training Prescription Yes Yes Yes Yes    Weight orange bands orange bands orange bands orange bands    Reps 10-12  10 minutes of strength training 10-12  10 minutes of strength training 10-12  10 minutes of strength training 10-12  10 minutes of strength training      Interval Training   Interval Training No  -  -  -      Oxygen   Oxygen Continuous Continuous Continuous Continuous    Liters 6 6 6 6       Treadmill   MPH  -  -  - 2.3    Grade  -  -  - 3    Minutes  -  -  - 17      NuStep   Level 6 6 6   -    Minutes 17 17 17   -    METs 2.6 2.9 2.3  -      Arm Ergometer   Level 3 4.6 5  -    Minutes 17 17 17   -      Track   Laps 16  - 18  -    Minutes 17  - 17  -       Exercise Comments:     Exercise Comments    Row Name 05/18/16 0752 06/20/16 0745 06/20/16 1547 07/17/16 1136 08/14/16 0944   Exercise Comments Aidah is scheduled to begin her exercise sessions today, 05/18/16 Patient is slowly progressing workload intensities. Has only attended 5 excerise sessions. Will cont. to monitor.  Home exercise completed with patient  Patient is progressing steadily. Walking 18 laps in 15 minutes. Motivated to progress in workloads.  Patient has had a few setbacks. However, is still increasing workloads. Will cont. to monitor and progress.      Discharge Exercise Prescription (Final Exercise Prescription Changes):     Exercise Prescription Changes - 08/24/16 1200  Response to Exercise   Blood Pressure (Admit) 110/76   Blood Pressure (Exercise) 120/70   Blood Pressure (Exit) 122/50   Heart Rate (Admit) 95 bpm   Heart Rate (Exercise) 112 bpm   Heart Rate (Exit) 101 bpm   Oxygen Saturation  (Admit) 93 %   Oxygen Saturation (Exercise) 96 %   Oxygen Saturation (Exit) 97 %   Rating of Perceived Exertion (Exercise) 12   Perceived Dyspnea (Exercise) 2   Duration Progress to 45 minutes of aerobic exercise without signs/symptoms of physical distress   Intensity THRR unchanged     Progression   Progression Continue to progress workloads to maintain intensity without signs/symptoms of physical distress.     Resistance Training   Training Prescription Yes   Weight orange bands   Reps 10-12  10 minutes of strength training     Oxygen   Oxygen Continuous   Liters 6     Treadmill   MPH 2.3   Grade 3   Minutes 17       Nutrition:  Target Goals: Understanding of nutrition guidelines, daily intake of sodium 1500mg , cholesterol 200mg , calories 30% from fat and 7% or less from saturated fats, daily to have 5 or more servings of fruits and vegetables.  Biometrics:     Pre Biometrics - 05/08/16 1156      Pre Biometrics   Grip Strength 30 kg       Nutrition Therapy Plan and Nutrition Goals:     Nutrition Therapy & Goals - 07/06/16 1349      Nutrition Therapy   Diet General, healthful     Personal Nutrition Goals   Personal Goal #1 1-2 lb wt loss/week to a wt loss goal of 6-24 lb at graduation from [place], educate and counsel regarding individualized specific dietary modifications aiming towards targeted core components such as weight, hypertension, lipid management, diabetes, heart failure and other comorbidities.;Nutrition handout(s) given to patient.  Low Fiber Nutrition Therapy, GERD Nutrition Therapy   Expected Outcomes Short Term Goal: Understand basic principles of dietary content, such as calories, fat, sodium, cholesterol and nutrients.;Long Term Goal: Adherence to prescribed nutrition plan.      Nutrition Discharge: Rate Your Plate Scores:     Nutrition Assessments - 07/06/16 1349      Rate  Your Plate Scores   Pre Score 52      Psychosocial: Target Goals: Acknowledge presence or absence of depression, maximize coping skills, provide positive support system. Participant is able to verbalize types and ability to use techniques and skills needed for reducing stress and depression.  Initial Review & Psychosocial Screening:     Initial Psych Review & Screening - 05/08/16 [place]? Yes     Barriers   Psychosocial barriers to participate in program There are no identifiable barriers or psychosocial needs.     Screening Interventions   Interventions Encouraged to exercise      Quality of Life Scores:     Quality of Life - 05/17/16 1550      Quality of Life Scores   Health/Function Pre 12.5 %   Socioeconomic Pre 22.25 %   Psych/Spiritual Pre 17.14 %   Family Pre 19.75 %   GLOBAL Pre 16.6 %      PHQ-9: Recent Review Flowsheet Data    Depression screen [facility] 2/9 05/08/2016   Decreased Interest 0  Down, Depressed, Hopeless 0   PHQ - 2 Score 0      Psychosocial Evaluation and Intervention:   Psychosocial Re-Evaluation:     Psychosocial Re-Evaluation    [place] Name 06/19/16 [phone] 07/18/16 0829 08/15/16 0834 09/11/16 1650       Psychosocial Re-Evaluation   Interventions Encouraged to attend Pulmonary Rehabilitation for the exercise Encouraged to attend Pulmonary Rehabilitation for the exercise Encouraged to attend Pulmonary Rehabilitation for the exercise Encouraged to attend Pulmonary Rehabilitation for the exercise    Comments no psycocial barriers identified during the past 30 days see comments section on ITP see comments section on ITP see comments section on ITP      Education: Education Goals: Education classes will be provided on a weekly basis, covering required topics. Participant will state understanding/return demonstration of topics presented.  Learning Barriers/Preferences:     Learning  Barriers/Preferences - 05/08/16 1124      Learning Barriers/Preferences   Learning Barriers None   Learning Preferences Written Material;Individual Instruction;Group Instruction      Education Topics: Risk Factor Reduction:  -Group instruction that is supported by a PowerPoint presentation. Instructor discusses the definition of a risk factor, different risk factors for pulmonary disease, and how the heart and lungs work together.     Nutrition for Pulmonary Patient:  -Group instruction provided by PowerPoint slides, verbal discussion, and written materials to support subject matter. The instructor gives an explanation and review of healthy diet recommendations, which includes a discussion on weight management, recommendations for fruit and vegetable consumption, as well as protein, fluid, caffeine, fiber, sodium, sugar, and alcohol. Tips for eating when patients are short of breath are discussed.   Pursed Lip Breathing:  -Group instruction that is supported by demonstration and informational handouts. Instructor discusses the benefits of pursed lip and diaphragmatic breathing and detailed demonstration on how to preform both.   Flowsheet Row PULMONARY REHAB OTHER RESPIRATORY from 08/24/2016 in [place]  Date  07/13/16  Educator  RT  Instruction Review Code  2- meets goals/outcomes      Oxygen Safety:  -Group instruction provided by PowerPoint, verbal discussion, and written material to support subject matter. There is an overview of "What is Oxygen" and "Why do we need it".  Instructor also reviews how to create a safe environment for oxygen use, the importance of using oxygen as prescribed, and the risks of noncompliance. There is a brief discussion on traveling with oxygen and resources the patient may utilize.   Oxygen Equipment:  -Group instruction provided by [facility] Staff utilizing handouts, written materials, and equipment  demonstrations.   Signs and Symptoms:  -Group instruction provided by written material and verbal discussion to support subject matter. Warning signs and symptoms of infection, stroke, and heart attack are reviewed and when to call the physician/911 reinforced. Tips for preventing the spread of infection discussed. Flowsheet Row PULMONARY REHAB OTHER RESPIRATORY from 08/24/2016 in [place]  Date  05/18/16  Educator  RN  Instruction Review Code  2- meets goals/outcomes      Advanced Directives:  -Group instruction provided by verbal instruction and written material to support subject matter. Instructor reviews Advanced Directive laws and proper instruction for filling out document.   Pulmonary Video:  -Group video education that reviews the importance of medication and oxygen compliance, exercise, good nutrition, pulmonary hygiene, and pursed lip and diaphragmatic breathing for the pulmonary patient. Flowsheet Row PULMONARY REHAB OTHER RESPIRATORY from 08/24/2016  in [place]  Date  08/24/16  Educator  video  Instruction Review Code  2- meets goals/outcomes      Exercise for the Pulmonary Patient:  -Group instruction that is supported by a PowerPoint presentation. Instructor discusses benefits of exercise, core components of exercise, frequency, duration, and intensity of an exercise routine, importance of utilizing pulse oximetry during exercise, safety while exercising, and options of places to exercise outside of rehab.     Pulmonary Medications:  -Verbally interactive group education provided by instructor with focus on inhaled medications and proper administration.   Anatomy and Physiology of the Respiratory System and Intimacy:  -Group instruction provided by PowerPoint, verbal discussion, and written material to support subject matter. Instructor reviews respiratory cycle and anatomical components of the  respiratory system and their functions. Instructor also reviews differences in obstructive and restrictive respiratory diseases with examples of each. Intimacy, Sex, and Sexuality differences are reviewed with a discussion on how relationships can change when diagnosed with pulmonary disease. Common sexual concerns are reviewed. Flowsheet Row PULMONARY REHAB OTHER RESPIRATORY from 08/24/2016 in [place]  Date  05/25/16  Educator  RN  Instruction Review Code  2- meets goals/outcomes      Knowledge Questionnaire Score:     Knowledge Questionnaire Score - 05/17/16 1549      Knowledge Questionnaire Score   Pre Score 11/13      Core Components/Risk Factors/Patient Goals at Admission:     Personal Goals and Risk Factors at Admission - 05/08/16 1116      Core Components/Risk Factors/Patient Goals on Admission    Weight Management Yes;Weight Loss   Intervention Weight Management: Develop a combined nutrition and exercise program designed to reach desired caloric intake, while maintaining appropriate intake of nutrient and fiber, sodium and fats, and appropriate energy expenditure required for the weight goal.   Admit Weight 155 lb 3.3 oz (70.4 kg)   Increase Strength and Stamina Yes   Intervention Provide advice, education, support and counseling about physical activity/exercise needs.;Develop an individualized exercise prescription for aerobic and resistive training based on initial evaluation findings, risk stratification, comorbidities and participant's personal goals.   Expected Outcomes Achievement of increased cardiorespiratory fitness and enhanced flexibility, muscular endurance and strength shown through measurements of functional capacity and personal statement of participant.   Improve shortness of breath with ADL's Yes   Intervention Provide education, individualized exercise plan and daily activity instruction to help decrease symptoms of SOB with  activities of daily living.   Expected Outcomes Short Term: Achieves a reduction of symptoms when performing activities of daily living.   Develop more efficient breathing techniques such as purse lipped breathing and diaphragmatic breathing; and practicing self-pacing with activity Yes   Intervention Provide education, demonstration and support about specific breathing techniuqes utilized for more efficient breathing. Include techniques such as pursed lipped breathing, diaphragmatic breathing and self-pacing activity.   Expected Outcomes Short Term: Participant will be able to demonstrate and use breathing techniques as needed throughout daily activities.   Increase knowledge of respiratory medications and ability to use respiratory devices properly  Yes   Intervention Provide education and demonstration as needed of appropriate use of medications, inhalers, and oxygen therapy.   Expected Outcomes Short Term: Achieves understanding of medications use. Understands that oxygen is a medication prescribed by physician. Demonstrates appropriate use of inhaler and oxygen therapy.      Core Components/Risk Factors/Patient Goals Review:  Goals and Risk Factor Review    Row Name 06/19/16 [id number] 07/18/16 0829 08/15/16 0834 09/11/16 1650       Core Components/Risk Factors/Patient Goals Review   Personal Goals Review Weight Management/Obesity;Increase knowledge of respiratory medications and ability to use respiratory devices properly.;Improve shortness of breath with ADL's;Increase Strength and Stamina;Develop more efficient breathing techniques such as purse lipped breathing and diaphragmatic breathing and practicing self-pacing with activity. Weight Management/Obesity;Increase knowledge of respiratory medications and ability to use respiratory devices properly.;Improve shortness of breath with ADL's;Increase Strength and Stamina;Develop more efficient breathing techniques such as purse lipped breathing  and diaphragmatic breathing and practicing self-pacing with activity. Weight Management/Obesity;Increase knowledge of respiratory medications and ability to use respiratory devices properly.;Improve shortness of breath with ADL's;Increase Strength and Stamina;Develop more efficient breathing techniques such as purse lipped breathing and diaphragmatic breathing and practicing self-pacing with activity. Weight Management/Obesity;Increase knowledge of respiratory medications and ability to use respiratory devices properly.;Improve shortness of breath with ADL's;Increase Strength and Stamina;Develop more efficient breathing techniques such as purse lipped breathing and diaphragmatic breathing and practicing self-pacing with activity.    Review see "comments" section on ITP see "comments" section on ITP see "comments" section on ITP see "comments" section on ITP    Expected Outcomes see Admission expected outcomes see Admission expected outcomes see Admission expected outcomes see Admission expected outcomes       Core Components/Risk Factors/Patient Goals at Discharge (Final Review):      Goals and Risk Factor Review - 09/11/16 1650      Core Components/Risk Factors/Patient Goals Review   Personal Goals Review Weight Management/Obesity;Increase knowledge of respiratory medications and ability to use respiratory devices properly.;Improve shortness of breath with ADL's;Increase Strength and Stamina;Develop more efficient breathing techniques such as purse lipped breathing and diaphragmatic breathing and practicing self-pacing with activity.   Review see "comments" section on ITP   Expected Outcomes see Admission expected outcomes      ITP Comments:   Comments:  Patient continues to be on medical hold for shingles. Her last day of attendance was 08/24/16. She was making progress towards pulmonary rehab goals until she broke out in shingles. She has been instructed to remain home until her shingles  lesions have crusted over and she has completed her course of medications.

## 2016-09-14 ENCOUNTER — Encounter (HOSPITAL_COMMUNITY): Payer: No Typology Code available for payment source

## 2016-09-19 ENCOUNTER — Encounter (HOSPITAL_COMMUNITY): Payer: No Typology Code available for payment source

## 2016-10-03 ENCOUNTER — Encounter (HOSPITAL_COMMUNITY): Payer: No Typology Code available for payment source

## 2016-10-05 ENCOUNTER — Encounter (HOSPITAL_COMMUNITY)
Admission: RE | Admit: 2016-10-05 | Discharge: 2016-10-05 | Disposition: A | Discharge status: Home / Self Care | Payer: No Typology Code available for payment source | Source: Ambulatory Visit | Attending: Pulmonary Disease | Admitting: Pulmonary Disease

## 2016-10-05 VITALS — Wt 153.2 lb

## 2016-10-05 DIAGNOSIS — J45909 Unspecified asthma, uncomplicated: Secondary | ICD-10-CM | POA: Diagnosis not present

## 2016-10-05 DIAGNOSIS — K219 Gastro-esophageal reflux disease without esophagitis: Secondary | ICD-10-CM | POA: Diagnosis not present

## 2016-10-05 DIAGNOSIS — Z7951 Long term (current) use of inhaled steroids: Secondary | ICD-10-CM | POA: Diagnosis not present

## 2016-10-05 DIAGNOSIS — J8410 Pulmonary fibrosis, unspecified: Secondary | ICD-10-CM

## 2016-10-05 DIAGNOSIS — E785 Hyperlipidemia, unspecified: Secondary | ICD-10-CM | POA: Diagnosis not present

## 2016-10-05 DIAGNOSIS — F419 Anxiety disorder, unspecified: Secondary | ICD-10-CM | POA: Diagnosis not present

## 2016-10-05 DIAGNOSIS — F329 Major depressive disorder, single episode, unspecified: Secondary | ICD-10-CM | POA: Diagnosis not present

## 2016-10-05 DIAGNOSIS — K7460 Unspecified cirrhosis of liver: Secondary | ICD-10-CM | POA: Diagnosis not present

## 2016-10-05 DIAGNOSIS — Z79899 Other long term (current) drug therapy: Secondary | ICD-10-CM | POA: Diagnosis not present

## 2016-10-05 DIAGNOSIS — Z87891 Personal history of nicotine dependence: Secondary | ICD-10-CM | POA: Diagnosis not present

## 2016-10-05 DIAGNOSIS — Z7952 Long term (current) use of systemic steroids: Secondary | ICD-10-CM | POA: Diagnosis not present

## 2016-10-05 NOTE — Progress Notes (Addendum)
Daily Session Note  Patient Details  Name: Olivia [name] MRN: [id number] Date of Birth: June 02, 1966 Referring Provider:   April Manson Pulmonary Rehab Walk Test from 05/11/2016 in [place]  Referring Provider  Dr. Gwenette Greet      Encounter Date: 10/05/2016  Check In:   Capillary Blood Glucose: No results found for this or any previous visit (from the past 24 hour(s)).   Goals Met:  Exercise tolerated well No report of cardiac concerns or symptoms Strength training completed today  Goals Unmet:  Not Applicable  Comments: Service time is from 10:30am to 12:15pm    Dr. Rush Farmer is Medical Director for Pulmonary Rehab at [facility].

## 2016-10-10 ENCOUNTER — Encounter (HOSPITAL_COMMUNITY): Admission: RE | Admit: 2016-10-10 | Payer: No Typology Code available for payment source | Source: Ambulatory Visit

## 2016-10-10 NOTE — Progress Notes (Deleted)
Daily Session Note  Patient Details  Name: Olivia [name] MRN: [id number] Date of Birth: July 22, 1966 Referring Provider:   April Manson Pulmonary Rehab Walk Test from 05/11/2016 in [place]  Referring Provider  Dr. Gwenette Greet      Encounter Date: 10/05/2016  Check In:   Capillary Blood Glucose: No results found for this or any previous visit (from the past 24 hour(s)).   Goals Met:  No report of cardiac concerns or symptoms Strength training completed today  Goals Unmet:  O2 Sat  Comments: Service time is from 10:30am to 12:30pm    Dr. Rush Farmer is Medical Director for Pulmonary Rehab at [facility].

## 2016-10-10 NOTE — Progress Notes (Signed)
Pulmonary Individual Treatment Plan  Patient Details  Name: Olivia [name] MRN: [id number] Date of Birth: February 03, 1966 Referring Provider:   April Manson Pulmonary Rehab Walk Test from 05/11/2016 in [place]  Referring Provider  Dr. Gwenette Greet      Initial Encounter Date:  Flowsheet Row Pulmonary Rehab Walk Test from 05/11/2016 in [place]  Date  05/11/16  Referring Provider  Dr. Gwenette Greet      Visit Diagnosis: Pulmonary fibrosis, unspecified ([place])  Patient's Home Medications on Admission:   Current Outpatient Prescriptions:  .  albuterol (PROVENTIL HFA;VENTOLIN HFA) 108 (90 Base) MCG/ACT inhaler, Inhale 2 puffs into the lungs every 6 (six) hours as needed for wheezing or shortness of breath., Disp: , Rfl:  .  azelastine (ASTELIN) 0.1 % nasal spray, Place 2 sprays into both nostrils 2 (two) times daily. Use in each nostril as directed, Disp: , Rfl:  .  benzonatate (TESSALON) 100 MG capsule, Take 100 mg by mouth 3 (three) times daily as needed for cough., Disp: , Rfl:  .  calcium-vitamin D (OSCAL WITH D) 500-200 MG-UNIT tablet, Take 1 tablet by mouth., Disp: , Rfl:  .  chlorpheniramine (CHLOR-TRIMETON) 4 MG tablet, Take 4 mg by mouth at bedtime., Disp: , Rfl:  .  Cholecalciferol (SM VITAMIN D3) 4000 units CAPS, Take 1 capsule by mouth daily., Disp: , Rfl:  .  codeine 30 MG tablet, Take 10 mg by mouth every 6 (six) hours as needed (cough). , Disp: , Rfl:  .  dextromethorphan-guaiFENesin (MUCINEX DM) 30-600 MG 12hr tablet, Take 1 tablet by mouth 2 (two) times daily as needed for cough., Disp: , Rfl:  .  estradiol (ESTRACE) 1 MG tablet, Take 1 mg by mouth daily., Disp: , Rfl:  .  FLUoxetine (PROZAC) 20 MG capsule, Take 40 mg by mouth 2 (two) times daily. , Disp: , Rfl:  .  fluticasone (FLONASE) 50 MCG/ACT nasal spray, Place 2 sprays into the nose daily., Disp: 16 g, Rfl: 2 .  gabapentin (NEURONTIN) 300 MG capsule, Take 300 mg by mouth  3 (three) times daily., Disp: , Rfl:  .  medroxyPROGESTERone (PROVERA) 2.5 MG tablet, Take 2.5 mg by mouth daily., Disp: , Rfl:  .  mycophenolate (CELLCEPT) 500 MG tablet, Take 1,000 mg by mouth 2 (two) times daily., Disp: , Rfl:  .  Olodaterol HCl (STRIVERDI RESPIMAT) 2.5 MCG/ACT AERS, Inhale 2 puffs into the lungs daily., Disp: , Rfl:  .  pantoprazole (PROTONIX) 20 MG tablet, Take 20 mg by mouth 2 (two) times daily., Disp: , Rfl:  .  predniSONE (DELTASONE) 20 MG tablet, Take 20 mg by mouth daily with breakfast., Disp: , Rfl:  .  sulfamethoxazole-trimethoprim (BACTRIM DS,SEPTRA DS) 800-160 MG tablet, Take 1 tablet by mouth 3 (three) times a week., Disp: , Rfl:   Past Medical History: Past Medical History:  Diagnosis Date  . Adenomatous colon polyp   . Anxiety and depression   . Asthma   . Cirrhosis ([place])   . Colon polyps   . GERD (gastroesophageal reflux disease)   . Hyperlipidemia   . IBS (irritable bowel syndrome)   . Iron deficiency anemia   . Migraine headache   . MVP (mitral valve prolapse)   . NSIP (nonspecific interstitial pneumonia) ([place])   . Pulmonary fibrosis, unspecified ([place])   . Spontaneous pneumothorax     Tobacco Use: History  Smoking Status  . Former Smoker  . Packs/day: 0.50  . Years:  20.00  . Types: Cigarettes  . Quit date: 11/06/1998  Smokeless Tobacco  . Never Used    Labs: Recent Review Flowsheet Data    Labs for ITP Cardiac and Pulmonary Rehab Latest Ref Rng & Units 02/01/2010   Cholestrol 0 - 200 mg/dL 205(H)   LDLDIRECT mg/dL 139.1   HDL >39.00 mg/dL 63.10   Trlycerides 0.0 - 149.0 mg/dL 107.0      Capillary Blood Glucose: No results found for: GLUCAP   ADL UCSD:     Pulmonary Assessment Scores    Row Name 05/17/16 1549         ADL UCSD   ADL Phase Entry     SOB Score total 67        Pulmonary Function Assessment:   Exercise Target Goals:    Exercise Program Goal: Individual exercise prescription set with THRR, safety &  activity barriers. Participant demonstrates ability to understand and report RPE using BORG scale, to self-measure pulse accurately, and to acknowledge the importance of the exercise prescription.  Exercise Prescription Goal: Starting with aerobic activity 30 plus minutes a day, 3 days per week for initial exercise prescription. Provide home exercise prescription and guidelines that participant acknowledges understanding prior to discharge.  Activity Barriers & Risk Stratification:     Activity Barriers & Cardiac Risk Stratification - 05/08/16 1123      Activity Barriers & Cardiac Risk Stratification   Activity Barriers Deconditioning;Shortness of Breath;Joint Problems      6 Minute Walk:     6 Minute Walk    Row Name 05/11/16 1638         6 Minute Walk   Phase Initial     Distance 1005 feet     Walk Time 6 minutes     # of Rest Breaks 0     MPH 1.9     METS 2.45     RPE 11     Perceived Dyspnea  1     Symptoms No     Resting HR 103 bpm     Resting BP 122/70     Max Ex. HR 119 bpm     Max Ex. BP 110/70       Interval HR   Baseline HR 103     1 Minute HR 112     2 Minute HR 119     3 Minute HR 118     4 Minute HR 115     5 Minute HR 116     6 Minute HR 101     2 Minute Post HR 96     Interval Heart Rate? Yes       Interval Oxygen   Interval Oxygen? Yes     Baseline Oxygen Saturation % 103 %     Baseline Liters of Oxygen 3 L     1 Minute Oxygen Saturation % 91 %     1 Minute Liters of Oxygen 3 L     2 Minute Oxygen Saturation % 86 %     2 Minute Liters of Oxygen 3 L     3 Minute Oxygen Saturation % 90 %     3 Minute Liters of Oxygen 4 L     4 Minute Oxygen Saturation % 88 %     4 Minute Liters of Oxygen 6 L     5 Minute Oxygen Saturation % 90 %     5 Minute Liters of Oxygen 6 L  6 Minute Oxygen Saturation % 98 %     6 Minute Liters of Oxygen 6 L     2 Minute Post Oxygen Saturation % 98 %     2 Minute Post Liters of Oxygen 6 L        Initial  Exercise Prescription:     Initial Exercise Prescription - 05/11/16 1600      Date of Initial Exercise RX and Referring Provider   Date 05/11/16   Referring Provider Dr. Gwenette Greet     NuStep   Level 1   Minutes 17   METs 1.7     Arm Ergometer   Level 1   Minutes 17     Track   Laps 5   Minutes 17     Prescription Details   Frequency (times per week) 2   Duration Progress to 45 minutes of aerobic exercise without signs/symptoms of physical distress     Intensity   THRR 40-80% of Max Heartrate 68-135   Perceived Dyspnea 0-4     Progression   Progression Continue progressive overload as per policy without signs/symptoms or physical distress.     Resistance Training   Training Prescription Yes   Weight orange bands   Reps 10-12      Perform Capillary Blood Glucose checks as needed.  Exercise Prescription Changes:     Exercise Prescription Changes    Row Name 05/18/16 1200 05/23/16 1200 05/25/16 1300 06/06/16 1200 06/13/16 1200     Exercise Review   Progression  -  - Yes  -  -     Response to Exercise   Blood Pressure (Admit) 134/70 118/70 116/70 104/70 110/64   Blood Pressure (Exercise) 130/76 132/80 120/66 120/70 104/60   Blood Pressure (Exit) 110/60 104/60 100/64 100/60 114/60   Heart Rate (Admit) 85 bpm 98 bpm 75 bpm 104 bpm 92 bpm   Heart Rate (Exercise) 86 bpm 118 bpm 113 bpm 108 bpm 98 bpm   Heart Rate (Exit) 86 bpm 83 bpm 83 bpm 81 bpm 80 bpm   Oxygen Saturation (Admit) 99 % 90 % 95 % 89 % 97 %   Oxygen Saturation (Exercise) 98 % 91 % 91 % 94 % 95 %   Oxygen Saturation (Exit) 99 % 98 % 97 % 98 % 100 %   Rating of Perceived Exertion (Exercise) 13 11 11 13 13    Perceived Dyspnea (Exercise) 2 1 1 2 3    Duration Progress to 45 minutes of aerobic exercise without signs/symptoms of physical distress Progress to 45 minutes of aerobic exercise without signs/symptoms of physical distress Progress to 45 minutes of aerobic exercise without signs/symptoms of  physical distress Progress to 45 minutes of aerobic exercise without signs/symptoms of physical distress Progress to 45 minutes of aerobic exercise without signs/symptoms of physical distress   Intensity THRR unchanged THRR unchanged THRR unchanged THRR unchanged THRR unchanged     Progression   Progression Continue to progress workloads to maintain intensity without signs/symptoms of physical distress. Continue to progress workloads to maintain intensity without signs/symptoms of physical distress. Continue to progress workloads to maintain intensity without signs/symptoms of physical distress. Continue to progress workloads to maintain intensity without signs/symptoms of physical distress. Continue to progress workloads to maintain intensity without signs/symptoms of physical distress.     Resistance Training   Training Prescription Yes Yes Yes Yes Yes   Weight orange bands orange bands orange bands orange bands orange bands   Reps 10-12 10-12 10-12  10 minutes 10-12  10 minutes 10-12  10 minutes of strength training     Interval Training   Interval Training No No No No No     Oxygen   Oxygen Continuous Continuous Continuous Continuous Continuous   Liters 6 6 6 6 6      NuStep   Level 1 1 2 2 2    Minutes 17 17 17 17 17    METs 1.4 1.7 1.8 2.8 1.7     Arm Ergometer   Level 1 1  - 1 1   Minutes 17 17  - 8 17     Track   Laps  - 12 14 14 14    Minutes  - 17 17 17 17    Row Name 06/20/16 1200 06/22/16 1200 07/06/16 1200 07/11/16 1200 07/13/16 1228     Exercise Review   Progression Yes  -  -  - Yes     Response to Exercise   Blood Pressure (Admit) 98/60 98/50 102/70 94/80 104/60   Blood Pressure (Exercise) 118/64 104/60 106/66 110/64 124/66   Blood Pressure (Exit) 96/60 102/66 100/60 92/62 120/82   Heart Rate (Admit) 91 bpm 98 bpm 73 bpm 99 bpm 94 bpm   Heart Rate (Exercise) 110 bpm 103 bpm 85 bpm 112 bpm 83 bpm   Heart Rate (Exit) 90 bpm 82 bpm 75 bpm 86 bpm 92 bpm   Oxygen  Saturation (Admit) 96 % 93 % 98 % 90 % 95 %   Oxygen Saturation (Exercise) 93 % 94 % 97 % 97 % 99 %   Oxygen Saturation (Exit) 100 % 97 % 99 % 100 % 92 %   Rating of Perceived Exertion (Exercise) 13 11 12 15 11    Perceived Dyspnea (Exercise) 2 3 1 2 1    Duration Progress to 45 minutes of aerobic exercise without signs/symptoms of physical distress Progress to 45 minutes of aerobic exercise without signs/symptoms of physical distress Progress to 45 minutes of aerobic exercise without signs/symptoms of physical distress Progress to 45 minutes of aerobic exercise without signs/symptoms of physical distress Progress to 45 minutes of aerobic exercise without signs/symptoms of physical distress   Intensity THRR unchanged THRR unchanged THRR unchanged THRR unchanged THRR unchanged     Progression   Progression Continue to progress workloads to maintain intensity without signs/symptoms of physical distress. Continue to progress workloads to maintain intensity without signs/symptoms of physical distress. Continue to progress workloads to maintain intensity without signs/symptoms of physical distress. Continue to progress workloads to maintain intensity without signs/symptoms of physical distress. Continue to progress workloads to maintain intensity without signs/symptoms of physical distress.     Resistance Training   Training Prescription Yes Yes Yes Yes Yes   Weight orange bands orange bands orange bands orange bands orange bands   Reps 10-12  10 minutes of strength training 10-12  10 minutes of strength training 10-12  10 minutes of strength training 10-12  10 minutes of strength training 10-12  10 minutes of strength training     Interval Training   Interval Training No No No No No     Oxygen   Oxygen Continuous Continuous Continuous Continuous Continuous   Liters 6  -  -  -  -     NuStep   Level  - 2  - 2 3   Minutes  - 17  - 17 17   METs  - 2.3  - 1.9 2.2     Arm Ergometer   Level  -  2  2 2  -   Minutes  - 17 17 17  -     Track   Laps  -  - 12 18 -   [place] to continue exercise at [facility] 3 additional days to program exercise sessions.  -  -  -  -   Row Name 07/18/16 1200 07/20/16 1200 07/25/16 1241 07/27/16 1300 08/03/16 1300     Exercise Review   Progression  - Yes  -  - Yes     Response to Exercise   Blood Pressure (Admit) 102/68 94/54 100/72 100/60 90/50   Blood Pressure (Exercise) 108/62 108/64 108/52  - 100/60   Blood Pressure (Exit) 112/68 100/60 92/60 101/71 96/50   Heart Rate (Admit) 92 bpm 94 bpm 94 bpm 104 bpm 92 bpm   Heart Rate (Exercise) 113 bpm 118 bpm 112 bpm  - 100 bpm   Heart Rate (Exit) 97 bpm 96 bpm 90 bpm 95 bpm 83 bpm   Oxygen Saturation (Admit) 92 % 92 % 98 % 96 % 95 %   Oxygen Saturation (Exercise) 92 % 91 % 96 %  - 94 %   Oxygen Saturation (Exit) 98 % 96 % 99 % 96 % 99 %   Rating of Perceived Exertion (Exercise) 13 13 13   - 12   Perceived Dyspnea (Exercise) 2 3 2   - 1.5   Duration Progress to 45 minutes of aerobic exercise without signs/symptoms of physical distress Progress to 45 minutes of aerobic exercise without signs/symptoms of physical distress Progress to 45 minutes of aerobic exercise without signs/symptoms of physical distress  - Progress to 45 minutes of aerobic exercise without signs/symptoms of physical distress   Intensity THRR unchanged THRR unchanged THRR unchanged  - THRR unchanged     Progression   Progression Continue to progress workloads to maintain intensity without signs/symptoms of physical distress. Continue to progress workloads to maintain intensity without signs/symptoms of physical distress. Continue to progress workloads to maintain intensity without signs/symptoms of physical distress.  - Continue to progress workloads to maintain intensity without signs/symptoms of physical distress.     Resistance Training   Training Prescription Yes Yes  Yes  - Yes   Weight orange bands orange bands orange bands  - orange bands   Reps 10-12  10 minutes of strength training 10-12  10 minutes of strength training 10-12  10 minutes of strength training  - 10-12  10 minutes of strength training     Interval Training   Interval Training No No No  - No     Oxygen   Oxygen Continuous Continuous Continuous Continuous Continuous   Liters  - 6 6 6 6      NuStep   Level 3  - 5  - 6   Minutes 17  - 17  - 17   METs 2.3  - 2.4  - 2.5     Arm Ergometer   Level 2 3 3   - 3   Minutes 17 17 17   - 17     Track   Laps 15 18 15 3   -   Minutes 17 17 17 5   -   Row Name 08/08/16 1223 08/17/16 1200 08/22/16 1200 08/24/16 1200 10/05/16 1200     Exercise Review   Progression  - Yes Yes  -  -  Response to Exercise   Blood Pressure (Admit) 110/62 98/60 90/56  110/76 104/70   Blood Pressure (Exercise) 110/68 124/70 136/70 120/70 128/66   Blood Pressure (Exit) 96/54 118/62 104/60 122/50 126/82   Heart Rate (Admit) 83 bpm 91 bpm 98 bpm 95 bpm 95 bpm   Heart Rate (Exercise) 102 bpm 106 bpm 124 bpm 112 bpm 107 bpm   Heart Rate (Exit) 73 bpm 89 bpm 82 bpm 101 bpm 84 bpm   Oxygen Saturation (Admit) 91 % 94 % 97 % 93 % 95 %   Oxygen Saturation (Exercise) 87 % 93 % 89 % 96 % 88 %   Oxygen Saturation (Exit) 99 % 98 % 98 % 97 % 99 %   Rating of Perceived Exertion (Exercise) 12 13 13 12 13    Perceived Dyspnea (Exercise) 1.5 1.5 2 2 2    Duration Progress to 45 minutes of aerobic exercise without signs/symptoms of physical distress Progress to 45 minutes of aerobic exercise without signs/symptoms of physical distress Progress to 45 minutes of aerobic exercise without signs/symptoms of physical distress Progress to 45 minutes of aerobic exercise without signs/symptoms of physical distress Progress to 45 minutes of aerobic exercise without signs/symptoms of physical distress   Intensity THRR unchanged THRR unchanged THRR unchanged THRR unchanged THRR unchanged      Progression   Progression Continue to progress workloads to maintain intensity without signs/symptoms of physical distress. Continue to progress workloads to maintain intensity without signs/symptoms of physical distress. Continue to progress workloads to maintain intensity without signs/symptoms of physical distress. Continue to progress workloads to maintain intensity without signs/symptoms of physical distress. Continue to progress workloads to maintain intensity without signs/symptoms of physical distress.     Resistance Training   Training Prescription Yes Yes Yes Yes Yes   Weight orange bands orange bands orange bands orange bands orange bands   Reps 10-12  10 minutes of strength training 10-12  10 minutes of strength training 10-12  10 minutes of strength training 10-12  10 minutes of strength training 10-12  10 minutes of strength training     Interval Training   Interval Training No  -  -  -  -     Oxygen   Oxygen Continuous Continuous Continuous Continuous Continuous   Liters 6 6 6 6 6      Treadmill   MPH  -  -  - 2.3  -   Grade  -  -  - 3  -   Minutes  -  -  - 17  -     NuStep   Level 6 6 6   - 5   Minutes 17 17 17   - 17   METs 2.6 2.9 2.3  - 1.8     Arm Ergometer   Level 3 4.6 5  - 3   Minutes 17 17 17   - 17     Track   Laps 16  - 18  -  -   Minutes 17  - 17  -  -      Exercise Comments:     Exercise Comments    Row Name 05/18/16 0752 06/20/16 0745 06/20/16 1547 07/17/16 1136 08/14/16 0944   Exercise Comments Lesvia is scheduled to begin her exercise sessions today, 05/18/16 Patient is slowly progressing workload intensities. Has only attended 5 excerise sessions. Will cont. to monitor.  Home exercise completed with patient  Patient is progressing steadily. Walking 18 laps in 15 minutes. Motivated to progress in workloads.  Patient has had a few setbacks. However, is still increasing workloads. Will cont. to monitor and progress.   [place] Name 10/09/16 1019            Exercise Comments Patient has been out due to illness. When patient came back to rehab we had to decrease workloads and increase oxygen liter flow. Will cont. to monitor and progress as able.           Discharge Exercise Prescription (Final Exercise Prescription Changes):     Exercise Prescription Changes - 10/05/16 1200      Response to Exercise   Blood Pressure (Admit) 104/70   Blood Pressure (Exercise) 128/66   Blood Pressure (Exit) 126/82   Heart Rate (Admit) 95 bpm   Heart Rate (Exercise) 107 bpm   Heart Rate (Exit) 84 bpm   Oxygen Saturation (Admit) 95 %   Oxygen Saturation (Exercise) 88 %   Oxygen Saturation (Exit) 99 %   Rating of Perceived Exertion (Exercise) 13   Perceived Dyspnea (Exercise) 2   Duration Progress to 45 minutes of aerobic exercise without signs/symptoms of physical distress   Intensity THRR unchanged     Progression   Progression Continue to progress workloads to maintain intensity without signs/symptoms of physical distress.     Resistance Training   Training Prescription Yes   Weight orange bands   Reps 10-12  10 minutes of strength training     Oxygen   Oxygen Continuous   Liters 6     NuStep   Level 5   Minutes 17   METs 1.8     Arm Ergometer   Level 3   Minutes 17       Nutrition:  Target Goals: Understanding of nutrition guidelines, daily intake of sodium 1500mg , cholesterol 200mg , calories 30% from fat and 7% or less from saturated fats, daily to have 5 or more servings of fruits and vegetables.  Biometrics:     Pre Biometrics - 05/08/16 1156      Pre Biometrics   Grip Strength 30 kg       Nutrition Therapy Plan and Nutrition Goals:     Nutrition Therapy & Goals - 07/06/16 1349      Nutrition Therapy   Diet General, healthful     Personal Nutrition Goals   Personal Goal #1 1-2 lb wt loss/week to a wt loss goal of 6-24 lb at graduation from [facility], educate and counsel regarding individualized specific dietary modifications aiming towards targeted core components such as weight, hypertension, lipid management, diabetes, heart failure and other comorbidities.;Nutrition handout(s) given to patient.  Low Fiber Nutrition Therapy, GERD Nutrition Therapy   Expected Outcomes Short Term Goal: Understand basic principles of dietary content, such as calories, fat, sodium, cholesterol and nutrients.;Long Term Goal: Adherence to prescribed nutrition plan.      Nutrition Discharge: Rate Your Plate Scores:     Nutrition Assessments - 07/06/16 1349      Rate Your Plate Scores   Pre Score 52      Psychosocial: Target Goals: Acknowledge presence or absence of depression, maximize coping skills, provide positive support system. Participant is able to verbalize types and ability to use techniques and skills needed for reducing stress and depression.  Initial Review & Psychosocial Screening:     Initial Psych Review & Screening - 05/08/16 [place]? Yes  Barriers   Psychosocial barriers to participate in program There are no identifiable barriers or psychosocial needs.     Screening Interventions   Interventions Encouraged to exercise      Quality of Life Scores:     Quality of Life - 05/17/16 1550      Quality of Life Scores   Health/Function Pre 12.5 %   Socioeconomic Pre 22.25 %   Psych/Spiritual Pre 17.14 %   Family Pre 19.75 %   GLOBAL Pre 16.6 %      PHQ-9: Recent Review Flowsheet Data    Depression screen [facility] 2/9 05/08/2016   Decreased Interest 0   Down, Depressed, Hopeless 0   PHQ - 2 Score 0      Psychosocial Evaluation and Intervention:   Psychosocial Re-Evaluation:     Psychosocial Re-Evaluation    Row Name 06/19/16 [id number] 07/18/16 0829 08/15/16 0834 09/11/16 1650 10/10/16 0849     Psychosocial Re-Evaluation   Interventions Encouraged to attend Pulmonary  Rehabilitation for the exercise Encouraged to attend Pulmonary Rehabilitation for the exercise Encouraged to attend Pulmonary Rehabilitation for the exercise Encouraged to attend Pulmonary Rehabilitation for the exercise Encouraged to attend Pulmonary Rehabilitation for the exercise   Comments no psycocial barriers identified during the past 30 days see comments section on ITP see comments section on ITP see comments section on ITP see comments section on ITP     Education: Education Goals: Education classes will be provided on a weekly basis, covering required topics. Participant will state understanding/return demonstration of topics presented.  Learning Barriers/Preferences:     Learning Barriers/Preferences - 05/08/16 1124      Learning Barriers/Preferences   Learning Barriers None   Learning Preferences Written Material;Individual Instruction;Group Instruction      Education Topics: Risk Factor Reduction:  -Group instruction that is supported by a PowerPoint presentation. Instructor discusses the definition of a risk factor, different risk factors for pulmonary disease, and how the heart and lungs work together.     Nutrition for Pulmonary Patient:  -Group instruction provided by PowerPoint slides, verbal discussion, and written materials to support subject matter. The instructor gives an explanation and review of healthy diet recommendations, which includes a discussion on weight management, recommendations for fruit and vegetable consumption, as well as protein, fluid, caffeine, fiber, sodium, sugar, and alcohol. Tips for eating when patients are short of breath are discussed.   Pursed Lip Breathing:  -Group instruction that is supported by demonstration and informational handouts. Instructor discusses the benefits of pursed lip and diaphragmatic breathing and detailed demonstration on how to preform both.   Flowsheet Row PULMONARY REHAB OTHER RESPIRATORY from 10/05/2016 in [place]  Date  07/13/16  Educator  RT  Instruction Review Code  2- meets goals/outcomes      Oxygen Safety:  -Group instruction provided by PowerPoint, verbal discussion, and written material to support subject matter. There is an overview of "What is Oxygen" and "Why do we need it".  Instructor also reviews how to create a safe environment for oxygen use, the importance of using oxygen as prescribed, and the risks of noncompliance. There is a brief discussion on traveling with oxygen and resources the patient may utilize.   Oxygen Equipment:  -Group instruction provided by [facility] Staff utilizing handouts, written materials, and equipment demonstrations.   Signs and Symptoms:  -Group instruction provided by written material and verbal discussion to support subject matter. Warning signs and symptoms of infection, stroke, and  heart attack are reviewed and when to call the physician/911 reinforced. Tips for preventing the spread of infection discussed. Flowsheet Row PULMONARY REHAB OTHER RESPIRATORY from 10/05/2016 in [place]  Date  05/18/16  Educator  RN  Instruction Review Code  2- meets goals/outcomes      Advanced Directives:  -Group instruction provided by verbal instruction and written material to support subject matter. Instructor reviews Advanced Directive laws and proper instruction for filling out document.   Pulmonary Video:  -Group video education that reviews the importance of medication and oxygen compliance, exercise, good nutrition, pulmonary hygiene, and pursed lip and diaphragmatic breathing for the pulmonary patient. Flowsheet Row PULMONARY REHAB OTHER RESPIRATORY from 10/05/2016 in [place]  Date  08/24/16  Educator  video  Instruction Review Code  2- meets goals/outcomes      Exercise for the Pulmonary Patient:  -Group instruction that is supported by a  PowerPoint presentation. Instructor discusses benefits of exercise, core components of exercise, frequency, duration, and intensity of an exercise routine, importance of utilizing pulse oximetry during exercise, safety while exercising, and options of places to exercise outside of rehab.     Pulmonary Medications:  -Verbally interactive group education provided by instructor with focus on inhaled medications and proper administration.   Anatomy and Physiology of the Respiratory System and Intimacy:  -Group instruction provided by PowerPoint, verbal discussion, and written material to support subject matter. Instructor reviews respiratory cycle and anatomical components of the respiratory system and their functions. Instructor also reviews differences in obstructive and restrictive respiratory diseases with examples of each. Intimacy, Sex, and Sexuality differences are reviewed with a discussion on how relationships can change when diagnosed with pulmonary disease. Common sexual concerns are reviewed. Flowsheet Row PULMONARY REHAB OTHER RESPIRATORY from 10/05/2016 in [place]  Date  05/25/16  Educator  RN  Instruction Review Code  2- meets goals/outcomes      Knowledge Questionnaire Score:     Knowledge Questionnaire Score - 05/17/16 1549      Knowledge Questionnaire Score   Pre Score 11/13      Core Components/Risk Factors/Patient Goals at Admission:     Personal Goals and Risk Factors at Admission - 05/08/16 1116      Core Components/Risk Factors/Patient Goals on Admission    Weight Management Yes;Weight Loss   Intervention Weight Management: Develop a combined nutrition and exercise program designed to reach desired caloric intake, while maintaining appropriate intake of nutrient and fiber, sodium and fats, and appropriate energy expenditure required for the weight goal.   Admit Weight 155 lb 3.3 oz (70.4 kg)   Increase Strength and Stamina Yes    Intervention Provide advice, education, support and counseling about physical activity/exercise needs.;Develop an individualized exercise prescription for aerobic and resistive training based on initial evaluation findings, risk stratification, comorbidities and participant's personal goals.   Expected Outcomes Achievement of increased cardiorespiratory fitness and enhanced flexibility, muscular endurance and strength shown through measurements of functional capacity and personal statement of participant.   Improve shortness of breath with ADL's Yes   Intervention Provide education, individualized exercise plan and daily activity instruction to help decrease symptoms of SOB with activities of daily living.   Expected Outcomes Short Term: Achieves a reduction of symptoms when performing activities of daily living.   Develop more efficient breathing techniques such as purse lipped breathing and diaphragmatic breathing; and practicing self-pacing with activity Yes   Intervention Provide  education, demonstration and support about specific breathing techniuqes utilized for more efficient breathing. Include techniques such as pursed lipped breathing, diaphragmatic breathing and self-pacing activity.   Expected Outcomes Short Term: Participant will be able to demonstrate and use breathing techniques as needed throughout daily activities.   Increase knowledge of respiratory medications and ability to use respiratory devices properly  Yes   Intervention Provide education and demonstration as needed of appropriate use of medications, inhalers, and oxygen therapy.   Expected Outcomes Short Term: Achieves understanding of medications use. Understands that oxygen is a medication prescribed by physician. Demonstrates appropriate use of inhaler and oxygen therapy.      Core Components/Risk Factors/Patient Goals Review:      Goals and Risk Factor Review    Row Name 06/19/16 0925 07/18/16 0829 08/15/16 0834  09/11/16 1650 10/10/16 0849     Core Components/Risk Factors/Patient Goals Review   Personal Goals Review Weight Management/Obesity;Increase knowledge of respiratory medications and ability to use respiratory devices properly.;Improve shortness of breath with ADL's;Increase Strength and Stamina;Develop more efficient breathing techniques such as purse lipped breathing and diaphragmatic breathing and practicing self-pacing with activity. Weight Management/Obesity;Increase knowledge of respiratory medications and ability to use respiratory devices properly.;Improve shortness of breath with ADL's;Increase Strength and Stamina;Develop more efficient breathing techniques such as purse lipped breathing and diaphragmatic breathing and practicing self-pacing with activity. Weight Management/Obesity;Increase knowledge of respiratory medications and ability to use respiratory devices properly.;Improve shortness of breath with ADL's;Increase Strength and Stamina;Develop more efficient breathing techniques such as purse lipped breathing and diaphragmatic breathing and practicing self-pacing with activity. Weight Management/Obesity;Increase knowledge of respiratory medications and ability to use respiratory devices properly.;Improve shortness of breath with ADL's;Increase Strength and Stamina;Develop more efficient breathing techniques such as purse lipped breathing and diaphragmatic breathing and practicing self-pacing with activity. Weight Management/Obesity;Increase knowledge of respiratory medications and ability to use respiratory devices properly.;Improve shortness of breath with ADL's;Increase Strength and Stamina;Develop more efficient breathing techniques such as purse lipped breathing and diaphragmatic breathing and practicing self-pacing with activity.   Review see "comments" section on ITP see "comments" section on ITP see "comments" section on ITP see "comments" section on ITP see "comments" section on ITP    Expected Outcomes see Admission expected outcomes see Admission expected outcomes see Admission expected outcomes see Admission expected outcomes see Admission expected outcomes      Core Components/Risk Factors/Patient Goals at Discharge (Final Review):      Goals and Risk Factor Review - 10/10/16 0849      Core Components/Risk Factors/Patient Goals Review   Personal Goals Review Weight Management/Obesity;Increase knowledge of respiratory medications and ability to use respiratory devices properly.;Improve shortness of breath with ADL's;Increase Strength and Stamina;Develop more efficient breathing techniques such as purse lipped breathing and diaphragmatic breathing and practicing self-pacing with activity.   Review see "comments" section on ITP   Expected Outcomes see Admission expected outcomes      ITP Comments:   Comments: ITP REVIEW Pt is not making expected progress toward pulmonary rehab goals after completing 20 sessions. She has had multiple setbacks including a long absence from shingles outbreak. She is also now being followed at [facility] and a referral has been placed for transplant consult. Patient now requires 15 liters oxygen with exertion. We are working in coordination with [facility] to get her adequate oxygen at home for exercise and exertion. At [facility] last pulmonary rehab visit she expressed emotional exhaustion. She stated she had not cried over her circumstances at  home and felt the need to cry. She was offered emotional support and reassurance. She was also encouraged to talk with her daughters about her feelings. She feels no need at this time to see a councilor. Recommend continued exercise, life style modification, education, and utilization of breathing techniques to increase stamina and strength and decrease shortness of breath with exertion.

## 2016-10-11 ENCOUNTER — Inpatient Hospital Stay (HOSPITAL_COMMUNITY)
Admission: EM | Admit: 2016-10-11 | Discharge: 2016-11-06 | DRG: 193 | Disposition: E | Payer: Managed Care, Other (non HMO) | Attending: Emergency Medicine | Admitting: Emergency Medicine

## 2016-10-11 ENCOUNTER — Emergency Department (HOSPITAL_COMMUNITY): Payer: Managed Care, Other (non HMO)

## 2016-10-11 ENCOUNTER — Encounter (HOSPITAL_COMMUNITY): Payer: Self-pay | Admitting: Emergency Medicine

## 2016-10-11 DIAGNOSIS — R739 Hyperglycemia, unspecified: Secondary | ICD-10-CM | POA: Diagnosis present

## 2016-10-11 DIAGNOSIS — T380X5A Adverse effect of glucocorticoids and synthetic analogues, initial encounter: Secondary | ICD-10-CM | POA: Diagnosis present

## 2016-10-11 DIAGNOSIS — K219 Gastro-esophageal reflux disease without esophagitis: Secondary | ICD-10-CM | POA: Diagnosis present

## 2016-10-11 DIAGNOSIS — E876 Hypokalemia: Secondary | ICD-10-CM | POA: Diagnosis present

## 2016-10-11 DIAGNOSIS — J8489 Other specified interstitial pulmonary diseases: Secondary | ICD-10-CM | POA: Diagnosis present

## 2016-10-11 DIAGNOSIS — Z7682 Awaiting organ transplant status: Secondary | ICD-10-CM | POA: Diagnosis not present

## 2016-10-11 DIAGNOSIS — G43909 Migraine, unspecified, not intractable, without status migrainosus: Secondary | ICD-10-CM | POA: Diagnosis present

## 2016-10-11 DIAGNOSIS — D649 Anemia, unspecified: Secondary | ICD-10-CM | POA: Diagnosis present

## 2016-10-11 DIAGNOSIS — F419 Anxiety disorder, unspecified: Secondary | ICD-10-CM | POA: Diagnosis present

## 2016-10-11 DIAGNOSIS — F410 Panic disorder [episodic paroxysmal anxiety] without agoraphobia: Secondary | ICD-10-CM | POA: Diagnosis present

## 2016-10-11 DIAGNOSIS — Z515 Encounter for palliative care: Secondary | ICD-10-CM | POA: Diagnosis not present

## 2016-10-11 DIAGNOSIS — J9621 Acute and chronic respiratory failure with hypoxia: Secondary | ICD-10-CM | POA: Diagnosis present

## 2016-10-11 DIAGNOSIS — Z9981 Dependence on supplemental oxygen: Secondary | ICD-10-CM | POA: Diagnosis not present

## 2016-10-11 DIAGNOSIS — K7460 Unspecified cirrhosis of liver: Secondary | ICD-10-CM | POA: Diagnosis present

## 2016-10-11 DIAGNOSIS — I341 Nonrheumatic mitral (valve) prolapse: Secondary | ICD-10-CM | POA: Diagnosis present

## 2016-10-11 DIAGNOSIS — J189 Pneumonia, unspecified organism: Secondary | ICD-10-CM | POA: Diagnosis present

## 2016-10-11 DIAGNOSIS — E785 Hyperlipidemia, unspecified: Secondary | ICD-10-CM | POA: Diagnosis present

## 2016-10-11 DIAGNOSIS — Z87891 Personal history of nicotine dependence: Secondary | ICD-10-CM | POA: Diagnosis not present

## 2016-10-11 DIAGNOSIS — K589 Irritable bowel syndrome without diarrhea: Secondary | ICD-10-CM | POA: Diagnosis present

## 2016-10-11 DIAGNOSIS — Z79899 Other long term (current) drug therapy: Secondary | ICD-10-CM

## 2016-10-11 DIAGNOSIS — J45909 Unspecified asthma, uncomplicated: Secondary | ICD-10-CM | POA: Diagnosis present

## 2016-10-11 DIAGNOSIS — I272 Pulmonary hypertension, unspecified: Secondary | ICD-10-CM | POA: Diagnosis present

## 2016-10-11 DIAGNOSIS — Z7952 Long term (current) use of systemic steroids: Secondary | ICD-10-CM | POA: Diagnosis not present

## 2016-10-11 DIAGNOSIS — Z885 Allergy status to narcotic agent status: Secondary | ICD-10-CM | POA: Diagnosis not present

## 2016-10-11 DIAGNOSIS — J849 Interstitial pulmonary disease, unspecified: Secondary | ICD-10-CM | POA: Diagnosis present

## 2016-10-11 DIAGNOSIS — Z90721 Acquired absence of ovaries, unilateral: Secondary | ICD-10-CM

## 2016-10-11 DIAGNOSIS — Y95 Nosocomial condition: Secondary | ICD-10-CM | POA: Diagnosis present

## 2016-10-11 DIAGNOSIS — R0602 Shortness of breath: Secondary | ICD-10-CM

## 2016-10-11 DIAGNOSIS — Z86010 Personal history of colonic polyps: Secondary | ICD-10-CM

## 2016-10-11 DIAGNOSIS — Z7951 Long term (current) use of inhaled steroids: Secondary | ICD-10-CM

## 2016-10-11 DIAGNOSIS — J9601 Acute respiratory failure with hypoxia: Secondary | ICD-10-CM | POA: Diagnosis present

## 2016-10-11 DIAGNOSIS — D899 Disorder involving the immune mechanism, unspecified: Secondary | ICD-10-CM | POA: Diagnosis not present

## 2016-10-11 DIAGNOSIS — J8410 Pulmonary fibrosis, unspecified: Secondary | ICD-10-CM | POA: Diagnosis present

## 2016-10-11 DIAGNOSIS — D509 Iron deficiency anemia, unspecified: Secondary | ICD-10-CM | POA: Diagnosis present

## 2016-10-11 DIAGNOSIS — F329 Major depressive disorder, single episode, unspecified: Secondary | ICD-10-CM | POA: Diagnosis present

## 2016-10-11 DIAGNOSIS — R0600 Dyspnea, unspecified: Secondary | ICD-10-CM | POA: Diagnosis not present

## 2016-10-11 DIAGNOSIS — Z66 Do not resuscitate: Secondary | ICD-10-CM | POA: Diagnosis present

## 2016-10-11 LAB — URINALYSIS, ROUTINE W REFLEX MICROSCOPIC
Bacteria, UA: NONE SEEN
GLUCOSE, UA: NEGATIVE mg/dL
HGB URINE DIPSTICK: NEGATIVE
Ketones, ur: 20 mg/dL — AB
Leukocytes, UA: NEGATIVE
NITRITE: NEGATIVE
PH: 5 (ref 5.0–8.0)
PROTEIN: 100 mg/dL — AB
Specific Gravity, Urine: 1.044 — ABNORMAL HIGH (ref 1.005–1.030)

## 2016-10-11 LAB — I-STAT VENOUS BLOOD GAS, ED
ACID-BASE EXCESS: 2 mmol/L (ref 0.0–2.0)
Bicarbonate: 27.7 mmol/L (ref 20.0–28.0)
O2 SAT: 97 %
PCO2 VEN: 44.9 mmHg (ref 44.0–60.0)
PO2 VEN: 96 mmHg — AB (ref 32.0–45.0)
TCO2: 29 mmol/L (ref 0–100)
pH, Ven: 7.399 (ref 7.250–7.430)

## 2016-10-11 LAB — CBC WITH DIFFERENTIAL/PLATELET
BASOS ABS: 0 10*3/uL (ref 0.0–0.1)
BASOS PCT: 0 %
EOS PCT: 0 %
Eosinophils Absolute: 0 10*3/uL (ref 0.0–0.7)
HCT: 35.3 % — ABNORMAL LOW (ref 36.0–46.0)
Hemoglobin: 11.6 g/dL — ABNORMAL LOW (ref 12.0–15.0)
Lymphocytes Relative: 7 %
Lymphs Abs: 0.7 10*3/uL (ref 0.7–4.0)
MCH: 30.3 pg (ref 26.0–34.0)
MCHC: 32.9 g/dL (ref 30.0–36.0)
MCV: 92.2 fL (ref 78.0–100.0)
MONO ABS: 0.3 10*3/uL (ref 0.1–1.0)
MONOS PCT: 3 %
Neutro Abs: 9.1 10*3/uL — ABNORMAL HIGH (ref 1.7–7.7)
Neutrophils Relative %: 90 %
PLATELETS: 287 10*3/uL (ref 150–400)
RBC: 3.83 MIL/uL — ABNORMAL LOW (ref 3.87–5.11)
RDW: 12.7 % (ref 11.5–15.5)
WBC: 10.2 10*3/uL (ref 4.0–10.5)

## 2016-10-11 LAB — BASIC METABOLIC PANEL
Anion gap: 11 (ref 5–15)
BUN: 8 mg/dL (ref 6–20)
CALCIUM: 9.6 mg/dL (ref 8.9–10.3)
CO2: 25 mmol/L (ref 22–32)
Chloride: 101 mmol/L (ref 101–111)
Creatinine, Ser: 0.73 mg/dL (ref 0.44–1.00)
GFR calc Af Amer: >60 mL/min (ref >60)
GLUCOSE: 125 mg/dL — AB (ref 65–99)
Potassium: 3.7 mmol/L (ref 3.5–5.1)
Sodium: 137 mmol/L (ref 135–145)

## 2016-10-11 NOTE — ED Triage Notes (Signed)
Per EMS pt from home has been feeling SOB, woke up feeling "drenched" and has had fever 99.6 yesterday. Pt has pulmonary fibrosis and has been feeling SOB for 2 months. Loss of appetite and pt normally on 3-15 L of oxygen

## 2016-10-11 NOTE — H&P (Signed)
History and Physical  Patient Name: Olivia [name]     [id number]    DOB: Feb 14, 1966    DOA: 10/09/2016 PCP: Cari Caraway, MD   Patient coming from: Home  Chief Complaint: Dyspnea, hypoxia, productive cough  HPI: RILEE VI is a 50 y.o. female with a past medical history significant for rheumatic disease NOS and interstitial lung disease followed by Dr. Gwenette Greet who presents with worsening dyspnea and hypoxia at home.  The patient's ILD was diagnosed in 2013, worsening in the last year. Over the last 5 months, she has had recurrent dyspnea, chest congestion, and productive cough with thick sputum. These have been treated with oral antibiotics by her pulmonologist several times as an outpatient when each time she would improve slightly while on antibiotics, only to have symptoms returned later.    Now in the last 2 weeks, the symptoms have returned, worse than before. Now she is not normally dyspneic, but she finds that she desats to the 70s with any exertion. She has low-grade fevers, cough productive of thick milky yellowish and greenish sputum, and dyspnea.  She has no leg swelling, orthopnea, PND.  Her home bronchodilator treatments have not helped.  Today her symptoms were severe, so she came to the ER.  ED course: -Afebrile, heart rate 88, respirations 26, oximetry 70s on room air, blood pressure 108/66 -Na 137, K 3.7, Cr 0.73, WBC 10.2K, Hgb 11.6 -UA clear -CXR with bilateral opacities -The case was discussed with Pulm who recommended hospitalist admission to [facility] and Pulmonary consult in AM   The patient has a long history of rheumatic disease, evidently with negative serologies, but has held diagnoses of psoriatic arthritis, RA, scleroderma, and Sjogren's at diferent times.  She was diagnosed with lung disease/pulmonary fibrosis around 2013, biopsy showed NSIP, became prednisone dependent in 2015, became O2 dependent in Jan/Feb 2017.  Started Cellcept in Feb 2017, went off in October  because she felt she had too many infections.  Consulted with [facility] in [place], restarted Cellcept at that time.        ROS: Review of Systems  Constitutional: Positive for fever.  Respiratory: Positive for cough, sputum production and shortness of breath.   Cardiovascular: Negative for orthopnea, leg swelling and PND.  All other systems reviewed and are negative.         Past Medical History:  Diagnosis Date  . Adenomatous colon polyp   . Anxiety and depression   . Asthma   . Cirrhosis ([place])   . Colon polyps   . GERD (gastroesophageal reflux disease)   . Hyperlipidemia   . IBS (irritable bowel syndrome)   . Iron deficiency anemia   . Migraine headache   . MVP (mitral valve prolapse)   . NSIP (nonspecific interstitial pneumonia) ([place])   . Pulmonary fibrosis, unspecified ([place])   . Spontaneous pneumothorax     Past Surgical History:  Procedure Laterality Date  . FINGER FRACTURE SURGERY    . NASAL SEPTUM SURGERY     x 3  . NISSEN FUNDOPLICATION  [id number]   with hiatal hernia repair: Dr. Kaylyn Lim  . oophoretomy  2003   for cyst: Dr. Alden Hipp  . TONSILLECTOMY  2005    Social History: Patient lives alone with dogs.  The patient walks unassisted.  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  From [place] originally.  Former smoker, remote.    Allergies  Allergen Reactions  . Darvocet [Propoxyphene N-Acetaminophen] Hives  . Hydrocodone Hives  .  Oxycodone Hives  . Vicodin [Hydrocodone-Acetaminophen] Hives    Family history: family history includes Cancer in her brother; Colitis in her maternal aunt; Colon polyps in her mother; Diabetes in her maternal grandfather; Diverticulitis in her brother; Hypertension in her father; Lung cancer in her mother; Stroke in her mother.  Prior to Admission medications   Medication Sig Start Date End Date Taking? Authorizing Provider  acetaminophen (TYLENOL) 500 MG tablet Take 1,000 mg by mouth every 6 (six) hours as needed for  fever.   Yes Historical Provider, MD  albuterol (PROVENTIL HFA;VENTOLIN HFA) 108 (90 Base) MCG/ACT inhaler Inhale 2 puffs into the lungs every 6 (six) hours as needed for wheezing or shortness of breath.   Yes Historical Provider, MD  Aspirin-Salicylamide-Caffeine (BC FAST PAIN RELIEF) 650-195-33.3 MG PACK Take 1 Package by mouth daily as needed.   Yes Historical Provider, MD  azelastine (ASTELIN) 0.1 % nasal spray Place 2 sprays into both nostrils daily. Use in each nostril as directed    Yes Historical Provider, MD  benzonatate (TESSALON) 100 MG capsule Take 100 mg by mouth 3 (three) times daily as needed for cough.   Yes Historical Provider, MD  calcium-vitamin D (OSCAL WITH D) 500-200 MG-UNIT tablet Take 1 tablet by mouth 2 (two) times daily.    Yes Historical Provider, MD  Cholecalciferol 2000 units CAPS Take 2,000 Units by mouth 2 (two) times daily.   Yes Historical Provider, MD  codeine 30 MG tablet Take 10 mg by mouth every 6 (six) hours as needed (cough).    Yes Historical Provider, MD  DM-Doxylamine-Acetaminophen (NYQUIL COLD & FLU PO) Take 30 mLs by mouth every 6 (six) hours as needed (COUGH).   Yes Historical Provider, MD  estradiol (ESTRACE) 1 MG tablet Take 1 mg by mouth daily.   Yes Historical Provider, MD  FLUoxetine (PROZAC) 20 MG capsule Take 20 mg by mouth at bedtime.    Yes Historical Provider, MD  fluticasone (FLONASE) 50 MCG/ACT nasal spray Place 2 sprays into the nose daily. 07/09/13  Yes Collene Gobble, MD  gabapentin (NEURONTIN) 300 MG capsule Take 300 mg by mouth 3 (three) times daily.   Yes Historical Provider, MD  guaiFENesin (MUCINEX) 600 MG 12 hr tablet Take 1,200 mg by mouth 2 (two) times daily as needed for to loosen phlegm.    Yes Historical Provider, MD  ibuprofen (ADVIL,MOTRIN) 200 MG tablet Take 400 mg by mouth every 6 (six) hours as needed for moderate pain.    Yes Historical Provider, MD  loratadine (CLARITIN) 10 MG tablet Take 10 mg by mouth at bedtime.   Yes  Historical Provider, MD  medroxyPROGESTERone (PROVERA) 2.5 MG tablet Take 2.5 mg by mouth at bedtime.    Yes Historical Provider, MD  mycophenolate (CELLCEPT) 500 MG tablet Take 1,000 mg by mouth 2 (two) times daily.   Yes Historical Provider, MD  pantoprazole (PROTONIX) 40 MG tablet Take 40 mg by mouth daily.   Yes Historical Provider, MD  predniSONE (DELTASONE) 20 MG tablet Take 20 mg by mouth daily with breakfast.   Yes Historical Provider, MD  pseudoephedrine (SUDAFED) 30 MG tablet Take 60 mg by mouth every 6 (six) hours as needed for congestion.    Yes Historical Provider, MD  sulfamethoxazole-trimethoprim (BACTRIM DS,SEPTRA DS) 800-160 MG tablet Take 1 tablet by mouth 3 (three) times a week.   Yes Historical Provider, MD  Olodaterol HCl (STRIVERDI RESPIMAT) 2.5 MCG/ACT AERS Inhale 2 puffs into the lungs daily.  Historical Provider, MD       Physical Exam: BP 105/61   Pulse 92   Temp 97.7 F (36.5 C) (Oral)   Resp (!) 35   SpO2 93%  General appearance: Well-developed, obese adult female, alert and in moderate distress from dyspnea.   Eyes: Anicteric, conjunctiva pink, lids and lashes normal. PERRL.    ENT: No nasal deformity, discharge, epistaxis.  Hearing normal. OP moist without lesions.   Neck: No neck masses.  Trachea midline.  No thyromegaly/tenderness. Lymph: No cervical or supraclavicular lymphadenopathy. Skin: Warm and dry.  No jaundice.  No suspicious rashes or lesions. Cardiac: Tachy, regular, nl S1-S2, no murmurs appreciated.  Capillary refill is brisk.  JVP not visible.  No LE edema.  Radial and DP pulses 2+ and symmetric. Respiratory: Tachypneic, appears short of breath, pursed lip breathing.  Fine crackles bilaterally c/w ILD.  No wheezing.  Somewhat diminihsed all over. Abdomen: Abdomen soft.  No TTP. No ascites, distension, hepatosplenomegaly.   MSK: No deformities or effusions.  No cyanosis.  Clubbing noted. Neuro: Cranial nerves normal.  Sensation intact to  light touch. Speech is fluent.  Muscle strength normal.    Psych: Sensorium intact and responding to questions, attention normal.  Behavior appropriate.  Affect anxious.  Judgment and insight appear normal.     Labs on Admission:  I have personally reviewed following labs and imaging studies: CBC:  Recent Labs Lab 10/14/2016 1945  WBC 10.2  NEUTROABS 9.1*  HGB 11.6*  HCT 35.3*  MCV 92.2  PLT [id number]   Basic Metabolic Panel:  Recent Labs Lab 10/10/2016 1945  NA 137  K 3.7  CL 101  CO2 25  GLUCOSE 125*  BUN 8  CREATININE 0.73  CALCIUM 9.6   GFR: Estimated Creatinine Clearance: 74.2 mL/min (by C-G formula based on SCr of 0.73 mg/dL).  Liver Function Tests: No results for input(s): AST, ALT, ALKPHOS, BILITOT, PROT, ALBUMIN in the last 168 hours. No results for input(s): LIPASE, AMYLASE in the last 168 hours. No results for input(s): AMMONIA in the last 168 hours. Coagulation Profile: No results for input(s): INR, PROTIME in the last 168 hours. Cardiac Enzymes: No results for input(s): CKTOTAL, CKMB, CKMBINDEX, TROPONINI in the last 168 hours. BNP (last 3 results) No results for input(s): PROBNP in the last 8760 hours. HbA1C: No results for input(s): HGBA1C in the last 72 hours. CBG: No results for input(s): GLUCAP in the last 168 hours. Lipid Profile: No results for input(s): CHOL, HDL, LDLCALC, TRIG, CHOLHDL, LDLDIRECT in the last 72 hours. Thyroid Function Tests: No results for input(s): TSH, T4TOTAL, FREET4, T3FREE, THYROIDAB in the last 72 hours. Anemia Panel: No results for input(s): VITAMINB12, FOLATE, FERRITIN, TIBC, IRON, RETICCTPCT in the last 72 hours. Sepsis Labs: Invalid input(s): PROCALCITONIN, LACTICIDVEN No results found for this or any previous visit (from the past 240 hour(s)).       Radiological Exams on Admission: Personally reviewed CXR shows bilateral opacities: Dg Chest Portable 1 View  Result Date: 10/22/2016 CLINICAL DATA:  Dyspnea. EXAM:  PORTABLE CHEST 1 VIEW COMPARISON:  09/22/2014 FINDINGS: Moderate vascular fullness and indistinctness. Diffuse interstitial thickening. Unchanged moderate cardiomegaly. No focal airspace consolidation. No large effusion. IMPRESSION: Vascular and interstitial changes suggest a component of congestive heart failure. No focal consolidation or large effusion. Electronically Signed   By: Andreas Newport M.D.   On: 10/30/2016 21:15    EKG: Independently reviewed. Rate 84, QTc 447, RSR'.  No changes from previous.  [facility], reportedly: Preserved EF PAP 30    Assessment/Plan  1. Acute on chronic hypoxic respiratory failure from pneumonia in ILD:  Subjective fevers, reported thick milky or yellowish sputum, question pneumonia or bronchitis (in immune suppressed patient) rather than progression of ILD.   -Vancomycin, cefepime, and azithromycin IV -Increase steroids temporarily --> Solumedrol 40 mg q6hrs -Gentle IV fluids for now -Admit to stepdown for close RT monitoring -Consult to Pulmonology -Continue home LABA  -Continue Cellcept   2. Other medications:  -Continue fluoxetine -Continue gabapentin -Continue PPI  3. Anemia:  Mild. Baseline 11.6-12.4      DVT prophylaxis: Lovenox  Code Status: FULL  Family Communication: Daughters at bedside.  CODE STATUS confirmed.  Disposition Plan: Anticipate IV antibiotics and increase steroids and Pulm consult. Consults called: Pulmonology Admission status: INPATIENT, stepdown     Medical decision making: Patient seen at 11:27 PM on 10/06/2016.  The patient was discussed with [facility] PA-C and Dr. Alvino Chapel.  What exists of the patient's chart was reviewed in depth and summarized above.  Clinical condition: tenuous respiratory status, will monitor in Stepdown given oxygen requirement.        Edwin Dada [facility] Pager [phone]

## 2016-10-11 NOTE — ED Notes (Signed)
Pt given graham crackers and juice

## 2016-10-11 NOTE — ED Provider Notes (Signed)
[facility] DEPT Provider Note   CSN: [id number] Arrival date & time: 10/18/2016  1919     History   Chief Complaint Chief Complaint  Patient presents with  . Shortness of Breath    HPI QUINCIE SEISS is a 50 y.o. female.  The history is provided by the patient, a relative and medical records. No language interpreter was used.  Shortness of Breath  Associated symptoms include cough. Pertinent negatives include no fever, no headaches, no chest pain, no vomiting, no abdominal pain and no leg swelling.   HONESTY SCHWALLIE is a 50 y.o. female  with a PMH of pulmonary fibrosis who presents to the Emergency Department complaining of worsening shortness of breath x 4 days. Patient states that she has been dealing with URI symptoms for several months now. In the last 4 months, she has completed 2 rounds of Cipro and one of Levaquin for pulmonary issues. She has also been on steroids multiple times in this time span. Currently on 10mg  prednisone daily. Followed by pulmonology, Dr. Lauris Chroman. Patient typically uses 4 L of oxygen at home. In the last 3-4 days, she has been maxing out her oxygen at 10 L. Children at bedside are concerned because they noted her oxygen drops into the 70's% with very little exertion despite doubling up on the amount of oxygen she typically requires. Associated with productive cough. + nausea, no emesis. She denies fevers/chills. No chest pain.   Past Medical History:  Diagnosis Date  . Adenomatous colon polyp   . Anxiety and depression   . Asthma   . Cirrhosis ([place])   . Colon polyps   . GERD (gastroesophageal reflux disease)   . Hyperlipidemia   . IBS (irritable bowel syndrome)   . Iron deficiency anemia   . Migraine headache   . MVP (mitral valve prolapse)   . NSIP (nonspecific interstitial pneumonia) ([place])   . Pulmonary fibrosis, unspecified ([place])   . Spontaneous pneumothorax     Patient Active Problem List   Diagnosis Date Noted  . Acute respiratory distress  10/21/2016  . Nonspecific interstitial pneumonitis, fibrosing type with organizing pneumonia 08/16/2016  . Chronic respiratory failure with hypoxia ([place]) 08/16/2016  . Acute sinusitis 06/27/2013  . Dyspnea 05/27/2013  . History of migraine headaches 07/21/2011  . HYPERLIPIDEMIA 02/11/2010  . NONSPEC ELEVATION OF LEVELS OF TRANSAMINASE/LDH 02/11/2010  . ELEVATED BLOOD PRESSURE WITHOUT DIAGNOSIS OF HYPERTENSION 11/15/2009    Past Surgical History:  Procedure Laterality Date  . FINGER FRACTURE SURGERY    . NASAL SEPTUM SURGERY     x 3  . NISSEN FUNDOPLICATION  [id number]   with hiatal hernia repair: Dr. Kaylyn Lim  . oophoretomy  2003   for cyst: Dr. Alden Hipp  . TONSILLECTOMY  2005    OB History    No data available       Home Medications    Prior to Admission medications   Medication Sig Start Date End Date Taking? Authorizing Provider  albuterol (PROVENTIL HFA;VENTOLIN HFA) 108 (90 Base) MCG/ACT inhaler Inhale 2 puffs into the lungs every 6 (six) hours as needed for wheezing or shortness of breath.    Historical Provider, MD  azelastine (ASTELIN) 0.1 % nasal spray Place 2 sprays into both nostrils 2 (two) times daily. Use in each nostril as directed    Historical Provider, MD  benzonatate (TESSALON) 100 MG capsule Take 100 mg by mouth 3 (three) times daily as needed for cough.    Historical Provider, MD  calcium-vitamin D (OSCAL WITH D) 500-200 MG-UNIT tablet Take 1 tablet by mouth.    Historical Provider, MD  chlorpheniramine (CHLOR-TRIMETON) 4 MG tablet Take 4 mg by mouth at bedtime.    Historical Provider, MD  Cholecalciferol (SM VITAMIN D3) 4000 units CAPS Take 1 capsule by mouth daily.    Historical Provider, MD  codeine 30 MG tablet Take 10 mg by mouth every 6 (six) hours as needed (cough).     Historical Provider, MD  dextromethorphan-guaiFENesin (MUCINEX DM) 30-600 MG 12hr tablet Take 1 tablet by mouth 2 (two) times daily as needed for cough.    Historical Provider, MD   estradiol (ESTRACE) 1 MG tablet Take 1 mg by mouth daily.    Historical Provider, MD  FLUoxetine (PROZAC) 20 MG capsule Take 40 mg by mouth 2 (two) times daily.     Historical Provider, MD  fluticasone (FLONASE) 50 MCG/ACT nasal spray Place 2 sprays into the nose daily. 07/09/13   Collene Gobble, MD  gabapentin (NEURONTIN) 300 MG capsule Take 300 mg by mouth 3 (three) times daily.    Historical Provider, MD  medroxyPROGESTERone (PROVERA) 2.5 MG tablet Take 2.5 mg by mouth daily.    Historical Provider, MD  mycophenolate (CELLCEPT) 500 MG tablet Take 1,000 mg by mouth 2 (two) times daily.    Historical Provider, MD  Olodaterol HCl (STRIVERDI RESPIMAT) 2.5 MCG/ACT AERS Inhale 2 puffs into the lungs daily.    Historical Provider, MD  pantoprazole (PROTONIX) 20 MG tablet Take 20 mg by mouth 2 (two) times daily.    Historical Provider, MD  predniSONE (DELTASONE) 20 MG tablet Take 20 mg by mouth daily with breakfast.    Historical Provider, MD  sulfamethoxazole-trimethoprim (BACTRIM DS,SEPTRA DS) 800-160 MG tablet Take 1 tablet by mouth 3 (three) times a week.    Historical Provider, MD    Family History Family History  Problem Relation Age of Onset  . Hypertension Father   . Lung cancer Mother   . Colon polyps Mother   . Stroke Mother   . Diabetes Maternal Grandfather   . Cancer Brother     oral squamous cell  . Colitis Maternal Aunt   . Diverticulitis Brother     also mother    Social History Social History  Substance Use Topics  . Smoking status: Former Smoker    Packs/day: 0.50    Years: 20.00    Types: Cigarettes    Quit date: 11/06/1998  . Smokeless tobacco: Never Used  . Alcohol use 0.0 oz/week     Comment: socially     Allergies   Darvocet [propoxyphene n-acetaminophen]; Hydrocodone; Oxycodone; and Vicodin [hydrocodone-acetaminophen]   Review of Systems Review of Systems  Constitutional: Negative for chills and fever.  HENT: Negative for congestion.   Eyes: Negative  for visual disturbance.  Respiratory: Positive for cough and shortness of breath.   Cardiovascular: Negative for chest pain and leg swelling.  Gastrointestinal: Positive for nausea. Negative for abdominal pain and vomiting.  Genitourinary: Negative for dysuria.  Musculoskeletal: Negative for back pain.  Skin: Negative for color change.  Neurological: Negative for headaches.     Physical Exam Updated Vital Signs BP 105/61   Pulse 92   Temp 97.7 F (36.5 C) (Oral)   Resp (!) 35   SpO2 93%   Physical Exam  Constitutional: She is oriented to person, place, and time. She appears well-developed and well-nourished. No distress.  HENT:  Head: Normocephalic and atraumatic.  Cardiovascular: Normal rate,  regular rhythm and normal heart sounds.   No murmur heard. Pulmonary/Chest:  Increased effort in breathing. Crackles to bilateral lower lung fields.  Abdominal: Soft. She exhibits no distension. There is no tenderness.  Musculoskeletal: She exhibits no edema.  Neurological: She is alert and oriented to person, place, and time.  Skin: Skin is warm and dry.  Nursing note and vitals reviewed.    ED Treatments / Results  Labs (all labs ordered are listed, but only abnormal results are displayed) Labs Reviewed  BASIC METABOLIC PANEL - Abnormal; Notable for the following:       Result Value   Glucose, Bld 125 (*)    All other components within normal limits  CBC WITH DIFFERENTIAL/PLATELET - Abnormal; Notable for the following:    RBC 3.83 (*)    Hemoglobin 11.6 (*)    HCT 35.3 (*)    Neutro Abs 9.1 (*)    All other components within normal limits  URINALYSIS, ROUTINE W REFLEX MICROSCOPIC - Abnormal; Notable for the following:    Color, Urine AMBER (*)    APPearance HAZY (*)    Specific Gravity, Urine 1.044 (*)    Bilirubin Urine SMALL (*)    Ketones, ur 20 (*)    Protein, ur 100 (*)    Squamous Epithelial / LPF 0-5 (*)    All other components within normal limits  I-STAT  VENOUS BLOOD GAS, ED - Abnormal; Notable for the following:    pO2, Ven 96.0 (*)    All other components within normal limits    EKG  EKG Interpretation  Date/Time:  Wednesday October 11 2016 19:29:46 EST Ventricular Rate:  84 PR Interval:    QRS Duration: 100 QT Interval:  378 QTC Calculation: 447 R Axis:   -18 Text Interpretation:  Sinus rhythm Borderline left axis deviation RSR' in V1 or V2, probably normal variant Borderline T abnormalities, inferior leads Baseline wander in lead(s) I II aVR aVF Confirmed by Alvino Chapel  MD, NATHAN [phone]) on 10/09/2016 9:35:23 PM       Radiology Dg Chest Portable 1 View  Result Date: 10/10/2016 CLINICAL DATA:  Dyspnea. EXAM: PORTABLE CHEST 1 VIEW COMPARISON:  09/22/2014 FINDINGS: Moderate vascular fullness and indistinctness. Diffuse interstitial thickening. Unchanged moderate cardiomegaly. No focal airspace consolidation. No large effusion. IMPRESSION: Vascular and interstitial changes suggest a component of congestive heart failure. No focal consolidation or large effusion. Electronically Signed   By: Andreas Newport M.D.   On: 10/21/2016 21:15    Procedures Procedures (including critical care time)  CRITICAL CARE Performed by: Ozella Almond Zarra Geffert   Total critical care time: 35 minutes  Critical care time was exclusive of separately billable procedures and treating other patients.  Critical care was necessary to treat or prevent imminent or life-threatening deterioration.  Critical care was time spent personally by me on the following activities: development of treatment plan with patient and/or surrogate as well as nursing, discussions with consultants, evaluation of patient's response to treatment, examination of patient, obtaining history from patient or surrogate, ordering and performing treatments and interventions, ordering and review of laboratory studies, ordering and review of radiographic studies, pulse oximetry and  re-evaluation of patient's condition.   Medications Ordered in ED Medications - No data to display   Initial Impression / Assessment and Plan / ED Course  I have reviewed the triage vital signs and the nursing notes.  Pertinent labs & imaging results that were available during my care of the patient were reviewed by me  and considered in my medical decision making (see chart for details).  Clinical Course    EISHA CONRATH is a 50 y.o. female with a history of pulmonary fibrosis followed by pulmonology who presents to ED for worsening difficulty breathing x 4 days. Typically on 4 L oxygen at home, but has been using 8 L while sleeping and maxed out at 10 L during the day. On exam, patient is afebrile with crackles to bilateral lung fields. She does have an increased effort in breathing. Will obtain labs and chest x-ray to further assess.  VBG with pH of 7.39, CO2 of 44.9. CBC with white count of 10.2, mild anemia.  CXR shows diffuse interstitial thickening and moderate vascular fullness.   9:55 PM - Spoke with pulmonology, Dr. Nelda Marseille, who recommends hospitalist admission and pulmonary to see patient in consultation.   10:10 PM - Consulted hospitalist who will admit.   Final Clinical Impressions(s) / ED Diagnoses   Final diagnoses:  Shortness of breath  Acute respiratory failure with hypoxia [facility])    New Prescriptions New Prescriptions   No medications on file     [facility] Loetta Connelley, PA-C 11/05/2016 2215    Davonna Belling, MD 10/31/2016 [phone]

## 2016-10-11 NOTE — ED Notes (Signed)
Pt got up to bedside commode, O2 stats dropped to 60% on high flow [place], pt became very anxious, placed on nonrebreather temporarily and back on high flow [place].

## 2016-10-11 NOTE — Progress Notes (Signed)
Placed patient on 10lpm high flow cannula. B/S fine crackles in lower lobes

## 2016-10-11 NOTE — ED Notes (Signed)
Hospitalist cleared pt to eat

## 2016-10-11 NOTE — ED Notes (Signed)
Pt placed on high flow nasal cannula by resp

## 2016-10-12 ENCOUNTER — Inpatient Hospital Stay (HOSPITAL_COMMUNITY): Payer: Managed Care, Other (non HMO)

## 2016-10-12 ENCOUNTER — Encounter (HOSPITAL_COMMUNITY): Admission: RE | Admit: 2016-10-12 | Payer: No Typology Code available for payment source | Source: Ambulatory Visit

## 2016-10-12 DIAGNOSIS — J9601 Acute respiratory failure with hypoxia: Secondary | ICD-10-CM | POA: Diagnosis present

## 2016-10-12 DIAGNOSIS — J849 Interstitial pulmonary disease, unspecified: Secondary | ICD-10-CM

## 2016-10-12 DIAGNOSIS — J9621 Acute and chronic respiratory failure with hypoxia: Secondary | ICD-10-CM

## 2016-10-12 DIAGNOSIS — R0600 Dyspnea, unspecified: Secondary | ICD-10-CM

## 2016-10-12 LAB — CBC
HCT: 32.3 % — ABNORMAL LOW (ref 36.0–46.0)
Hemoglobin: 10.8 g/dL — ABNORMAL LOW (ref 12.0–15.0)
MCH: 30.9 pg (ref 26.0–34.0)
MCHC: 33.4 g/dL (ref 30.0–36.0)
MCV: 92.3 fL (ref 78.0–100.0)
PLATELETS: 246 10*3/uL (ref 150–400)
RBC: 3.5 MIL/uL — AB (ref 3.87–5.11)
RDW: 12.7 % (ref 11.5–15.5)
WBC: 11.5 10*3/uL — ABNORMAL HIGH (ref 4.0–10.5)

## 2016-10-12 LAB — GLUCOSE, CAPILLARY
Glucose-Capillary: 111 mg/dL — ABNORMAL HIGH (ref 65–99)
Glucose-Capillary: 135 mg/dL — ABNORMAL HIGH (ref 65–99)
Glucose-Capillary: 149 mg/dL — ABNORMAL HIGH (ref 65–99)

## 2016-10-12 LAB — BASIC METABOLIC PANEL
Anion gap: 10 (ref 5–15)
BUN: 7 mg/dL (ref 6–20)
CALCIUM: 8.9 mg/dL (ref 8.9–10.3)
CHLORIDE: 100 mmol/L — AB (ref 101–111)
CO2: 25 mmol/L (ref 22–32)
CREATININE: 0.77 mg/dL (ref 0.44–1.00)
GFR calc non Af Amer: >60 mL/min (ref >60)
GLUCOSE: 140 mg/dL — AB (ref 65–99)
Potassium: 3.3 mmol/L — ABNORMAL LOW (ref 3.5–5.1)
Sodium: 135 mmol/L (ref 135–145)

## 2016-10-12 LAB — LACTIC ACID, PLASMA
LACTIC ACID, VENOUS: 0.8 mmol/L (ref 0.5–1.9)
LACTIC ACID, VENOUS: 1.2 mmol/L (ref 0.5–1.9)

## 2016-10-12 LAB — EXPECTORATED SPUTUM ASSESSMENT W REFEX TO RESP CULTURE

## 2016-10-12 LAB — BRAIN NATRIURETIC PEPTIDE: B NATRIURETIC PEPTIDE 5: 55.7 pg/mL (ref 0.0–100.0)

## 2016-10-12 LAB — ECHOCARDIOGRAM COMPLETE

## 2016-10-12 LAB — EXPECTORATED SPUTUM ASSESSMENT W GRAM STAIN, RFLX TO RESP C

## 2016-10-12 LAB — STREP PNEUMONIAE URINARY ANTIGEN: Strep Pneumo Urinary Antigen: NEGATIVE

## 2016-10-12 LAB — INFLUENZA PANEL BY PCR (TYPE A & B)
INFLAPCR: NEGATIVE
Influenza B By PCR: NEGATIVE

## 2016-10-12 LAB — CBG MONITORING, ED: GLUCOSE-CAPILLARY: 157 mg/dL — AB (ref 65–99)

## 2016-10-12 LAB — MRSA PCR SCREENING: MRSA BY PCR: NEGATIVE

## 2016-10-12 MED ORDER — BENZONATATE 100 MG PO CAPS
100.0000 mg | ORAL_CAPSULE | Freq: Two times a day (BID) | ORAL | Status: DC | PRN
  Administered 2016-10-12 – 2016-10-14 (×4): 100 mg via ORAL
  Filled 2016-10-12 (×7): qty 1

## 2016-10-12 MED ORDER — PROMETHAZINE HCL 25 MG PO TABS: 25.0000 mg | ORAL_TABLET | Freq: Four times a day (QID) | ORAL | Status: DC | PRN

## 2016-10-12 MED ORDER — POTASSIUM CHLORIDE CRYS ER 10 MEQ PO TBCR
50.0000 meq | EXTENDED_RELEASE_TABLET | Freq: Once | ORAL | Status: AC
  Administered 2016-10-12: 13:00:00 50 meq via ORAL
  Filled 2016-10-12: qty 1

## 2016-10-12 MED ORDER — METHYLPREDNISOLONE SODIUM SUCC 125 MG IJ SOLR
INTRAMUSCULAR | Status: AC
  Administered 2016-10-12: 40 mg
  Filled 2016-10-12: qty 2

## 2016-10-12 MED ORDER — LORAZEPAM 2 MG/ML IJ SOLN
0.5000 mg | Freq: Four times a day (QID) | INTRAMUSCULAR | Status: DC | PRN
  Administered 2016-10-12 – 2016-10-16 (×10): 0.5 mg via INTRAVENOUS
  Filled 2016-10-12 (×13): qty 1

## 2016-10-12 MED ORDER — ENOXAPARIN SODIUM 40 MG/0.4ML ~~LOC~~ SOLN
40.0000 mg | SUBCUTANEOUS | Status: DC
  Administered 2016-10-12 – 2016-10-15 (×4): 40 mg via SUBCUTANEOUS
  Filled 2016-10-12 (×5): qty 0.4

## 2016-10-12 MED ORDER — CEFEPIME HCL 1 G IJ SOLR
1.0000 g | Freq: Three times a day (TID) | INTRAMUSCULAR | Status: DC
  Administered 2016-10-12 – 2016-10-15 (×12): 1 g via INTRAVENOUS
  Filled 2016-10-12 (×16): qty 1

## 2016-10-12 MED ORDER — ACETAMINOPHEN 650 MG RE SUPP: 650.0000 mg | Freq: Four times a day (QID) | RECTAL | Status: DC | PRN

## 2016-10-12 MED ORDER — ARFORMOTEROL TARTRATE 15 MCG/2ML IN NEBU
15.0000 ug | INHALATION_SOLUTION | Freq: Two times a day (BID) | RESPIRATORY_TRACT | Status: DC
  Administered 2016-10-12 – 2016-10-15 (×8): 15 ug via RESPIRATORY_TRACT
  Filled 2016-10-12 (×11): qty 2

## 2016-10-12 MED ORDER — ACETAMINOPHEN 325 MG PO TABS
650.0000 mg | ORAL_TABLET | Freq: Four times a day (QID) | ORAL | Status: DC | PRN
  Administered 2016-10-12 – 2016-10-15 (×5): 650 mg via ORAL
  Filled 2016-10-12 (×6): qty 2

## 2016-10-12 MED ORDER — GABAPENTIN 300 MG PO CAPS
300.0000 mg | ORAL_CAPSULE | Freq: Three times a day (TID) | ORAL | Status: DC
  Administered 2016-10-12 – 2016-10-15 (×10): 300 mg via ORAL
  Filled 2016-10-12 (×14): qty 1

## 2016-10-12 MED ORDER — FUROSEMIDE 10 MG/ML IJ SOLN
80.0000 mg | Freq: Once | INTRAMUSCULAR | Status: AC
  Administered 2016-10-12: 80 mg via INTRAVENOUS
  Filled 2016-10-12 (×2): qty 8

## 2016-10-12 MED ORDER — INSULIN ASPART 100 UNIT/ML ~~LOC~~ SOLN
2.0000 [IU] | SUBCUTANEOUS | Status: DC
  Administered 2016-10-12 – 2016-10-13 (×4): 2 [IU] via SUBCUTANEOUS
  Administered 2016-10-13: 6 [IU] via SUBCUTANEOUS
  Administered 2016-10-13 – 2016-10-14 (×8): 2 [IU] via SUBCUTANEOUS
  Administered 2016-10-15: 4 [IU] via SUBCUTANEOUS
  Administered 2016-10-15 (×3): 2 [IU] via SUBCUTANEOUS
  Administered 2016-10-15: 4 [IU] via SUBCUTANEOUS

## 2016-10-12 MED ORDER — LEVALBUTEROL HCL 0.63 MG/3ML IN NEBU
0.6300 mg | INHALATION_SOLUTION | Freq: Four times a day (QID) | RESPIRATORY_TRACT | Status: DC
Start: 2016-10-12 — End: 2016-10-16
  Administered 2016-10-12 – 2016-10-16 (×15): 0.63 mg via RESPIRATORY_TRACT
  Filled 2016-10-12 (×28): qty 3

## 2016-10-12 MED ORDER — SODIUM CHLORIDE 0.9 % IV SOLN
INTRAVENOUS | Status: DC
  Administered 2016-10-12: 01:00:00 via INTRAVENOUS

## 2016-10-12 MED ORDER — ALBUTEROL SULFATE (2.5 MG/3ML) 0.083% IN NEBU: 2.5000 mg | INHALATION_SOLUTION | RESPIRATORY_TRACT | Status: DC | PRN

## 2016-10-12 MED ORDER — PREDNISONE 20 MG PO TABS: 40.0000 mg | ORAL_TABLET | Freq: Every day | ORAL | Status: DC

## 2016-10-12 MED ORDER — MORPHINE SULFATE (PF) 4 MG/ML IV SOLN
1.0000 mg | Freq: Once | INTRAVENOUS | Status: AC
  Administered 2016-10-12: 1 mg via INTRAVENOUS
  Filled 2016-10-12: qty 1

## 2016-10-12 MED ORDER — ONDANSETRON HCL 4 MG/2ML IJ SOLN
4.0000 mg | Freq: Four times a day (QID) | INTRAMUSCULAR | Status: DC | PRN
  Administered 2016-10-12 – 2016-10-13 (×2): 4 mg via INTRAVENOUS
  Filled 2016-10-12 (×2): qty 2

## 2016-10-12 MED ORDER — IPRATROPIUM BROMIDE 0.02 % IN SOLN
0.5000 mg | Freq: Four times a day (QID) | RESPIRATORY_TRACT | Status: DC
  Administered 2016-10-12 – 2016-10-16 (×15): 0.5 mg via RESPIRATORY_TRACT
  Filled 2016-10-12 (×16): qty 2.5

## 2016-10-12 MED ORDER — PROMETHAZINE HCL 25 MG RE SUPP
25.0000 mg | Freq: Four times a day (QID) | RECTAL | Status: DC | PRN
Start: 2016-10-12 — End: 2016-10-16
  Filled 2016-10-12: qty 1

## 2016-10-12 MED ORDER — FLUOXETINE HCL 20 MG PO CAPS
20.0000 mg | ORAL_CAPSULE | Freq: Every day | ORAL | Status: DC
  Administered 2016-10-12 – 2016-10-15 (×4): 20 mg via ORAL
  Filled 2016-10-12 (×4): qty 1

## 2016-10-12 MED ORDER — PANTOPRAZOLE SODIUM 40 MG PO TBEC
40.0000 mg | DELAYED_RELEASE_TABLET | Freq: Every day | ORAL | Status: DC
  Administered 2016-10-12 – 2016-10-14 (×3): 40 mg via ORAL
  Filled 2016-10-12 (×4): qty 1

## 2016-10-12 MED ORDER — AZITHROMYCIN 500 MG IV SOLR
500.0000 mg | INTRAVENOUS | Status: DC
  Administered 2016-10-12 – 2016-10-14 (×4): 500 mg via INTRAVENOUS
  Filled 2016-10-12 (×5): qty 500

## 2016-10-12 MED ORDER — MYCOPHENOLATE MOFETIL 250 MG PO CAPS
1000.0000 mg | ORAL_CAPSULE | Freq: Two times a day (BID) | ORAL | Status: DC
  Administered 2016-10-12 – 2016-10-14 (×6): 1000 mg via ORAL
  Filled 2016-10-12 (×8): qty 4

## 2016-10-12 MED ORDER — PROMETHAZINE HCL 25 MG/ML IJ SOLN
12.5000 mg | Freq: Four times a day (QID) | INTRAMUSCULAR | Status: DC | PRN
  Administered 2016-10-12: 12.5 mg via INTRAVENOUS
  Filled 2016-10-12: qty 1

## 2016-10-12 MED ORDER — MYCOPHENOLATE MOFETIL 500 MG PO TABS: 1000.0000 mg | ORAL_TABLET | Freq: Two times a day (BID) | ORAL | Status: DC

## 2016-10-12 MED ORDER — ONDANSETRON HCL 4 MG PO TABS
4.0000 mg | ORAL_TABLET | Freq: Four times a day (QID) | ORAL | Status: DC | PRN
  Administered 2016-10-12: 4 mg via ORAL
  Filled 2016-10-12: qty 1

## 2016-10-12 MED ORDER — LORAZEPAM 2 MG/ML IJ SOLN
0.5000 mg | Freq: Once | INTRAMUSCULAR | Status: AC
  Administered 2016-10-12: 0.5 mg via INTRAVENOUS
  Filled 2016-10-12: qty 1

## 2016-10-12 MED ORDER — METHYLPREDNISOLONE SODIUM SUCC 40 MG IJ SOLR
40.0000 mg | Freq: Four times a day (QID) | INTRAMUSCULAR | Status: DC
  Administered 2016-10-12 – 2016-10-15 (×15): 40 mg via INTRAVENOUS
  Filled 2016-10-12 (×17): qty 1

## 2016-10-12 NOTE — Progress Notes (Signed)
Patient c/o pressure in her nasal area. Placed patient on 100% cool mist face bucket. Patients Sp02 level 88-94%. Patient does have desaturation with any exertion.

## 2016-10-12 NOTE — Progress Notes (Signed)
eLink Physician-Brief Progress Note Patient Name: Olivia [name] DOB: Sep 05, 1966 MRN: [id number]   Date of Service  10/12/2016  HPI/Events of Note  Admitted with acute on chronic resp failure/ ILD  eICU Interventions  Notes/ orders reviewed/ nothing to add/ will continue to monitor     Intervention Category Major Interventions: Respiratory failure - evaluation and management  Christinia Gully 10/12/2016, 4:34 PM

## 2016-10-12 NOTE — Progress Notes (Signed)
Paged [facility] to confirm pt can have sips with meds. Will follow orders and continue to monitor.  Arnell Sieving, RN

## 2016-10-12 NOTE — ED Notes (Signed)
Pt coughing and anxious, pt states dropped into the 50's, c/o nausea, MD paged, new orders for tesslon, high flow heated [place], and phenergan

## 2016-10-12 NOTE — ED Notes (Signed)
Critical care at bedside  

## 2016-10-12 NOTE — ED Notes (Signed)
Pt oxygen saturations drop to 79% on high flow nasal cannula due to coughing, instructed to take deep breaths

## 2016-10-12 NOTE — ED Notes (Signed)
[place] NP insistent on inserting foley prior to administration of IV lasix.  Pt does not meet CAUTI ED placement algorithm.  Notified Dewaine Oats that this would be an exception.

## 2016-10-12 NOTE — Consult Note (Signed)
PULMONARY / CRITICAL CARE MEDICINE   Name: Olivia [name] MRN: [id number] DOB: 1966-08-01    ADMISSION DATE:  10/10/2016 CONSULTATION DATE:  10/12/2016  REFERRING MD:  Dr. Alvino Chapel   CHIEF COMPLAINT:  Dyspnea, hypoxia, productive cough   HISTORY OF PRESENT ILLNESS:    50 year old female with PMH of IBS, GERD, Cirrhosis, rheumatic disease NOS and non-specific ILD (followed by Dr. Gwenette Greet, diagnosed in 2013) who is oxygen dependent 3-8L presents to ED 12/6 with increasing dyspnea and hypoxia at home. Patient states that over the last 5 months she has experienced chest congestion, productive cough with thick yellowish, green sputum, and dyspnea. During this time has been seen by pulmonology where she was treated with oral antibiotics. While on the antibiotics the symptoms improve but then seem to return shortly after. Upon arrival to ED patient had titrate her home oxygen up to 15L, while in ED patient placed on high flow cannula.   Currently seen in pulmonary rehab. Followed at the [facility] center. VA echo showed preserved EF, PAP 30. Has been approved through the [place] to see ILD clinic at [facility]. Is currently on lung transplant list at [facility]  Remains on CellCept, prednisone, and Bactrim. CXR revealed vascular fullness and cardiomegaly. PCCM called to consult.   PAST MEDICAL HISTORY :  She  has a past medical history of Adenomatous colon polyp; Anxiety and depression; Asthma; Cirrhosis ([place]); Colon polyps; GERD (gastroesophageal reflux disease); Hyperlipidemia; IBS (irritable bowel syndrome); Iron deficiency anemia; Migraine headache; MVP (mitral valve prolapse); NSIP (nonspecific interstitial pneumonia) ([place]); Pulmonary fibrosis, unspecified ([place]); and Spontaneous pneumothorax.  PAST SURGICAL HISTORY: She  has a past surgical history that includes Nissen fundoplication ([id number]); Tonsillectomy (2005); oophoretomy (2003); Nasal septum surgery; and Finger fracture surgery.  Allergies  Allergen Reactions  .  Darvocet [Propoxyphene N-Acetaminophen] Hives  . Hydrocodone Hives  . Oxycodone Hives  . Vicodin [Hydrocodone-Acetaminophen] Hives    No current facility-administered medications on file prior to encounter.    Current Outpatient Prescriptions on File Prior to Encounter  Medication Sig  . albuterol (PROVENTIL HFA;VENTOLIN HFA) 108 (90 Base) MCG/ACT inhaler Inhale 2 puffs into the lungs every 6 (six) hours as needed for wheezing or shortness of breath.  Marland Kitchen azelastine (ASTELIN) 0.1 % nasal spray Place 2 sprays into both nostrils daily. Use in each nostril as directed   . benzonatate (TESSALON) 100 MG capsule Take 100 mg by mouth 3 (three) times daily as needed for cough.  . calcium-vitamin D (OSCAL WITH D) 500-200 MG-UNIT tablet Take 1 tablet by mouth 2 (two) times daily.   . codeine 30 MG tablet Take 10 mg by mouth every 6 (six) hours as needed (cough).   Marland Kitchen estradiol (ESTRACE) 1 MG tablet Take 1 mg by mouth daily.  Marland Kitchen FLUoxetine (PROZAC) 20 MG capsule Take 20 mg by mouth at bedtime.   . fluticasone (FLONASE) 50 MCG/ACT nasal spray Place 2 sprays into the nose daily.  Marland Kitchen gabapentin (NEURONTIN) 300 MG capsule Take 300 mg by mouth 3 (three) times daily.  . medroxyPROGESTERone (PROVERA) 2.5 MG tablet Take 2.5 mg by mouth at bedtime.   . mycophenolate (CELLCEPT) 500 MG tablet Take 1,000 mg by mouth 2 (two) times daily.  . predniSONE (DELTASONE) 20 MG tablet Take 20 mg by mouth daily with breakfast.  . sulfamethoxazole-trimethoprim (BACTRIM DS,SEPTRA DS) 800-160 MG tablet Take 1 tablet by mouth 3 (three) times a week.  . Olodaterol HCl (STRIVERDI RESPIMAT) 2.5 MCG/ACT AERS Inhale 2 puffs into the  lungs daily.    FAMILY HISTORY:  Her indicated that the status of her mother is unknown. She indicated that the status of her father is unknown. She indicated that the status of her maternal grandfather is unknown. She indicated that the status of her maternal aunt is unknown.    SOCIAL HISTORY: She   reports that she quit smoking about 17 years ago. Her smoking use included Cigarettes. She has a 10.00 pack-year smoking history. She has never used smokeless tobacco. She reports that she drinks alcohol. She reports that she does not use drugs.  REVIEW OF SYSTEMS:   All negative; except for those that are bolded, which indicate positives.  Constitutional: weight loss, weight gain, night sweats, fevers, chills, fatigue, weakness.  HEENT: headaches, sore throat, sneezing, nasal congestion, post nasal drip, difficulty swallowing, tooth/dental problems, visual complaints, visual changes, ear aches. Neuro: difficulty with speech, weakness, numbness, ataxia. CV:  chest pain, orthopnea, PND, swelling in lower extremities, dizziness, palpitations, syncope.  Resp: cough, hemoptysis, dyspnea, wheezing. GI: heartburn, indigestion, abdominal pain, nausea, vomiting, diarrhea, constipation, change in bowel habits, loss of appetite, hematemesis, melena, hematochezia.  GU: dysuria, change in color of urine, urgency or frequency, flank pain, hematuria. MSK: joint pain or swelling, decreased range of motion. Psych: change in mood or affect, depression, anxiety, suicidal ideations, homicidal ideations. Skin: rash, itching, bruising.   SUBJECTIVE:  Acutely Distressed female, sitting in bed, bent over complaining of SOB. Upon movement patient has documented oxygen saturations of 50-60%/    VITAL SIGNS: BP 110/77   Pulse (!) 121   Temp 97.7 F (36.5 C) (Oral)   Resp (!) 45   SpO2 93%   HEMODYNAMICS:    VENTILATOR SETTINGS: FiO2 (%):  [50 %-100 %] 100 %  INTAKE / OUTPUT: I/O last 3 completed shifts: In: 600 [I.V.:300; IV Piggyback:300] Out: -   PHYSICAL EXAMINATION: General: Adult female, in acute resp. Distress, in bed on high flow nasal cannula  Neuro: alert and oriented, follows commands   HEENT: normocephalic  Cardiovascular: s1/s2, tachy, regular rhythm  Lungs: diminished at bases,  inspiratory wheezing, labored  Abdomen: non-distended, non-tender, active bowel sounds   Musculoskeletal: no deformities  Skin: warm, dry, intact    LABS:  BMET  Recent Labs Lab 11/02/2016 1945 10/12/16 0237  NA 137 135  K 3.7 3.3*  CL 101 100*  CO2 25 25  BUN 8 7  CREATININE 0.73 0.77  GLUCOSE 125* 140*    Electrolytes  Recent Labs Lab 10/24/2016 1945 10/12/16 0237  CALCIUM 9.6 8.9    CBC  Recent Labs Lab 10/09/2016 1945 10/12/16 0237  WBC 10.2 11.5*  HGB 11.6* 10.8*  HCT 35.3* 32.3*  PLT 287 246    Coag's No results for input(s): APTT, INR in the last 168 hours.  Sepsis Markers No results for input(s): LATICACIDVEN, PROCALCITON, O2SATVEN in the last 168 hours.  ABG No results for input(s): PHART, PCO2ART, PO2ART in the last 168 hours.  Liver Enzymes No results for input(s): AST, ALT, ALKPHOS, BILITOT, ALBUMIN in the last 168 hours.  Cardiac Enzymes No results for input(s): TROPONINI, PROBNP in the last 168 hours.  Glucose No results for input(s): GLUCAP in the last 168 hours.  Imaging Dg Chest Portable 1 View  Result Date: 10/24/2016 CLINICAL DATA:  Dyspnea. EXAM: PORTABLE CHEST 1 VIEW COMPARISON:  09/22/2014 FINDINGS: Moderate vascular fullness and indistinctness. Diffuse interstitial thickening. Unchanged moderate cardiomegaly. No focal airspace consolidation. No large effusion. IMPRESSION: Vascular and interstitial changes suggest  a component of congestive heart failure. No focal consolidation or large effusion. Electronically Signed   By: Andreas Newport M.D.   On: 11/01/2016 21:15     STUDIES:  CXR 12/7 > Vascular fullness and diffuse interstitial thickening. Cardiomegaly.   CULTURES: Urine 12/7> Sputum 12/7> Blood 12/7>   ANTIBIOTICS: Azithromycin 12/7 > Cefepime 12/7 >   SIGNIFICANT EVENTS: 12/7 > presents to ED with dyspnea   LINES/TUBES:   DISCUSSION: 50 year old  Female with extensive lung diease, on transplant list a  [facility]. Presents to 12/6 from home is acute respiratory failure requiring high flow oxygen. Has had one month history of URI symptoms, cultures sent, will place on antibiotics and solu-medrol.   ASSESSMENT / PLAN:  PULMONARY A: Acute on Chronic Respiratory Failure secondary to possible CAP vs exacerbation vs pneumonitis  H/O Nonspecific ILD  P:   Supplemental oxygen for saturation > 88  Continue Albuterol PRN Continue Brovana BID  Continue solu-medrol  Bronch would be optimal for specimen collection however, at this time is unable to be performed   CARDIOVASCULAR A:  ? Component of HF - Last Echo Negative  P:  Cardiac Monitoring  Lasix 80 meq - ordered by ED  Strict I & O  Echo pending   RENAL A:   Hypokalemia  P:   Trend BMP  Replace electrolytes as needed   GASTROINTESTINAL A:   No issues  P:   PPI NPO - risk for BIPAP/ETT   HEMATOLOGIC A:   No issues  P:  Trend CBC   INFECTIOUS A:   Sepsis secondary to CAP vs exacerbation vs pneumonitis   H/O rheumatic disease NOS  P:   PAN culture  Flu swab  Trend Lactic Acid Trend WBC and fever curve  Continue azithromycin and cefepime   ENDOCRINE A:   At risk for hyperglycemia  P:   SSI in setting on stress dose steroids  Q4H Glucose Checks  NEUROLOGIC A:   H/O anxiety  P:   Continue home Prozac  Continue home Neurontin  PRN ativan  RASS goal: 0     FAMILY  - Updates: family at bedside and updated on plan   - Inter-disciplinary family meet or Palliative Care meeting due by: 12/14   Hayden Pedro, AG-ACNP [facility]  Pgr: [phone]  PCCM Pgr: [phone]  Attending Note:  I have examined patient, reviewed labs, studies and notes. I have discussed the case with Beaulah Corin, and I agree with the data and plans as amended above.   Very complicated case, now presenting as acute on chronic hypoxemic resp failure. 50 yo woman with connective tissue overlap syndrome and  resultant fibrotic NSIP (bx proven) for which she has been managed by Dr Gwenette Greet. She has been on cellcept and prednisone with waxing / waning dosing for the last 2 yrs. Tolerating the cellcept has been difficult due to multiple complications, but she is currently on it.  I reviewed her course with Dr Gwenette Greet by phone today. A second opinion at [facility] recommended continue immunosuppression, weaning pred as able, referral to transplant list (difficulty through the [place] system). Her infiltrates, cough / secretions, hypoxemia have progressed, particularly over the last 2 months. She was seen earlier this week and was started on cipro for possible bronchitis. She presented today with worsening dyspnea and hypoxemia. On my eval here today she has a very high O2 requirement - 10L/min high flow + 1.00 NRB mask - still desaturates with any  movement. She has coarse BS with insp crackles. Appears uncomfortable, tired. Her CXR shows B interstitial infiltrates - no recent comparison available since 2015. This sound like a sub-acute decompensation. Consider a flare of her ILD, consider opportunistic infection (viral, bacterial, fungal; she has been on PCP prophylaxis but also a possibility). Consider also cardiogenic pulm edema but last TTE did not show any evidence for LV dysfxn. At this point I would recommend treatment for all of the main causes, aggressive support w O2 and biPAP if required. Will use empiric steroids, empiric broad abx, empiric diuresis. I suspect that she would do quite poorly if she were to decompensate to require MV - she would likely not be extubated. We will continue to discuss goals for care with her - she is trying to process the severity current illness and options for her care. If she does get intubated then I would recommend FOB and BAL to look for organisms.   Independent critical care time is 60 minutes.   Baltazar Apo, MD, PhD 10/12/2016, 1:47 PM [facility] [phone] or  if no answer [phone]

## 2016-10-12 NOTE — ED Notes (Signed)
Pt continues to have intermittent panic attacks causing her SpO2 to decreased into the 60s.  Pt is able to be refocused on her breathing with verbal direction increasing her SpO2 to 88%.

## 2016-10-12 NOTE — ED Notes (Addendum)
Per Dewaine Oats, NP notified of missed foley attempted who then stated there is no longer a need to insert foley in the ED and to hold lasix until foley insertion on inpt unit.

## 2016-10-12 NOTE — Progress Notes (Signed)
After giving patient scheduled nebulizer treatment patient sats were reading 95%.  Attempted to decreased FIO2 from 100% to 90% and flow from 35 to 30 on patient however sats immediately dropped to high 70s.  Placed patient back on 100% but left flow at 35 and sats improved to 90%.  Will continue to monitor patient.

## 2016-10-12 NOTE — ED Notes (Signed)
Admitting physician notified that pt work of breathing is increased and saturations are dropping in the 50's continuing to decrease with coughing. Respiratory notified

## 2016-10-12 NOTE — Progress Notes (Signed)
Placed patient on heated high flow cannula with 100% and flow set at 35LPM. Sp02=90-94%

## 2016-10-12 NOTE — Care Management Note (Signed)
Case Management Note  Patient Details  Name: Olivia [name] MRN: [id number] Date of Birth: 07-Jun-1966  Subjective/Objective:                  50 year old female with PMH of IBS, GERD, Cirrhosis, rheumatic disease NOS and non-specific ILD (followed by Dr. Gwenette Greet, diagnosed in 2013) who is oxygen dependent 3-8L presents to ED 12/6 with increasing dyspnea and hypoxia at home. From home alone.  Action/Plan: Follow for disposition needs.   Expected Discharge Date:  10/15/16               Expected Discharge Plan:  [place]  In-House Referral:  NA  Discharge planning Services  CM Consult    Additional Comments: Followed at the [facility].  Has been approved through the [place] to see ILD clinic at [facility]. Is currently on lung transplant list at [facility].   Fuller Mandril, RN 10/12/2016, 11:42 AM

## 2016-10-12 NOTE — ED Notes (Addendum)
Notified rapid response RN Langley Gauss of pt and their condition who confirmed need for critical care to evaluate pt.  Stated critical care would be by to see pt shortly.

## 2016-10-12 NOTE — ED Notes (Signed)
Critical care remains at bedside.

## 2016-10-12 NOTE — ED Notes (Signed)
Pt got up to use restroom without calling for assistance.  Pt family called for assistance after pt was on bedside commode.  Pt was anxious and panicking, requesting the door to the room left open and requesting this RN to move out of the door way.  Pt SpO2 decreases to 60% on 100% O2 with any movement.  Notified Dr Sherral Hammers of request for critical care consultation and ICU bed.

## 2016-10-13 DIAGNOSIS — J9601 Acute respiratory failure with hypoxia: Secondary | ICD-10-CM

## 2016-10-13 LAB — GLUCOSE, CAPILLARY
GLUCOSE-CAPILLARY: 135 mg/dL — AB (ref 65–99)
GLUCOSE-CAPILLARY: 207 mg/dL — AB (ref 65–99)
GLUCOSE-CAPILLARY: 138 mg/dL — AB (ref 65–99)
Glucose-Capillary: 129 mg/dL — ABNORMAL HIGH (ref 65–99)
Glucose-Capillary: 130 mg/dL — ABNORMAL HIGH (ref 65–99)
Glucose-Capillary: 134 mg/dL — ABNORMAL HIGH (ref 65–99)

## 2016-10-13 LAB — BASIC METABOLIC PANEL
Anion gap: 10 (ref 5–15)
BUN: 15 mg/dL (ref 6–20)
CHLORIDE: 100 mmol/L — AB (ref 101–111)
CO2: 26 mmol/L (ref 22–32)
CREATININE: 0.8 mg/dL (ref 0.44–1.00)
Calcium: 8.7 mg/dL — ABNORMAL LOW (ref 8.9–10.3)
GFR calc non Af Amer: >60 mL/min (ref >60)
Glucose, Bld: 153 mg/dL — ABNORMAL HIGH (ref 65–99)
Potassium: 3.7 mmol/L (ref 3.5–5.1)
Sodium: 136 mmol/L (ref 135–145)

## 2016-10-13 LAB — URINE CULTURE: CULTURE: NO GROWTH

## 2016-10-13 LAB — CBC
HEMATOCRIT: 34.5 % — AB (ref 36.0–46.0)
HEMOGLOBIN: 11.2 g/dL — AB (ref 12.0–15.0)
MCH: 30.2 pg (ref 26.0–34.0)
MCHC: 32.5 g/dL (ref 30.0–36.0)
MCV: 93 fL (ref 78.0–100.0)
PLATELETS: 317 10*3/uL (ref 150–400)
RBC: 3.71 MIL/uL — AB (ref 3.87–5.11)
RDW: 12.8 % (ref 11.5–15.5)
WBC: 10.3 10*3/uL (ref 4.0–10.5)

## 2016-10-13 LAB — LEGIONELLA PNEUMOPHILA SEROGP 1 UR AG: L. pneumophila Serogp 1 Ur Ag: NEGATIVE

## 2016-10-13 LAB — PHOSPHORUS: Phosphorus: 2.5 mg/dL (ref 2.5–4.6)

## 2016-10-13 LAB — MAGNESIUM: Magnesium: 2.2 mg/dL (ref 1.7–2.4)

## 2016-10-13 MED ORDER — ORAL CARE MOUTH RINSE
15.0000 mL | Freq: Two times a day (BID) | OROMUCOSAL | Status: DC
  Administered 2016-10-13 – 2016-10-15 (×3): 15 mL via OROMUCOSAL

## 2016-10-13 MED ORDER — CHLORHEXIDINE GLUCONATE 0.12 % MT SOLN
15.0000 mL | Freq: Two times a day (BID) | OROMUCOSAL | Status: DC
  Administered 2016-10-14: 15 mL via OROMUCOSAL

## 2016-10-13 NOTE — Progress Notes (Signed)
Dr. Jimmy Footman notified of decreased UO. No orders at this time. Will continue to monitor.  Arnell Sieving, RN

## 2016-10-13 NOTE — Progress Notes (Signed)
Pt just had desaturation episode. Pt had a coughing spell in which dropped her SATs in the 60's. Pt was encouraged to take some deep breaths in through her nose. Pt is tolerating HFNC well. SATs recovered after about 74minutes of post ive reinforcement. RT will monitor.

## 2016-10-13 NOTE — Care Management Note (Addendum)
Case Management Note  Patient Details  Name: Olivia [name] MRN: [id number] Date of Birth: 02-May-1966  Subjective/Objective:   Pt admitted with shortness of breath/chronic resp failure - has extensive pulmonary history of fibrosis                 Action/Plan:  PTA from home with daughters.  Pt is on high flow oxygen at home and requiring both HFNC and Non rebreather here at [facility] .  Family meeting today - pt does not want to be intubated.  Plan is to continue to treat infection however if pt declines the decision may be made to transition to comfort care.  Per attending ; pt is not stable for transfer nor discharge home at this time.  [facility] notified of admit - transfer form was not requested.  Pt is not a candidate for transplant.  CM will continue to follow for discharge needs   Expected Discharge Date:  10/15/16               Expected Discharge Plan:     In-House Referral:  Clinical Social Work  Discharge planning Services  CM Consult  Post Acute Care Choice:    Choice offered to:     DME Arranged:    DME Agency:     HH Arranged:    [place] Agency:     Status of Service:  In process, will continue to follow  If discussed at Long Length of Stay Meetings, dates discussed:    Additional Comments:  Maryclare Labrador, RN 10/13/2016, 3:03 PM

## 2016-10-13 NOTE — Progress Notes (Signed)
Weaning down fio2. O2 sats are 96%

## 2016-10-13 NOTE — Plan of Care (Signed)
  Interdisciplinary Goals of Care Family Meeting   Date carried out:: 10/13/2016  Location of the meeting: Bedside  Member's involved: Physician, Bedside Registered Nurse, Social Worker and Family Member or next of kin  Durable Power of Attorney or Loss adjuster, chartered: Patient    Discussion: We discussed goals of care for [employer] .  I am familiar with her case from clinic and have discussed her problem with her [place] doctor (Dr. Danton Sewer) recently and with Dr. Alisa Graff from [facility] this morning.  She has progressive fibrotic NSIP and is not a transplant candidate due to acid reflux and being overweight.  She currently is acutely ill with severe hypoxemic respiratory failure from HCAP.  I explained to her family that life support measures (CPR, mechanical ventilation) would cause more harm than good.  She voiced understanding as did her extended family including her daughter.    Code status: Full DNR  Disposition: Continue current acute care > however if her condition deteriorates we will change to full comfort measures  Time spent for the meeting: 30 minutes  Simonne Maffucci 10/13/2016, 9:50 AM

## 2016-10-13 NOTE — Progress Notes (Signed)
PULMONARY / CRITICAL CARE MEDICINE   Name: Olivia [name] MRN: [id number] DOB: 1966/08/09    ADMISSION DATE:  10/07/2016 CONSULTATION DATE:  10/12/2016  REFERRING MD:  Dr. Alvino Chapel   CHIEF COMPLAINT:  Dyspnea, hypoxia, productive cough   HISTORY OF PRESENT ILLNESS:    50 year old female with PMH of IBS, GERD, Cirrhosis, rheumatic disease NOS and non-specific ILD (followed by Dr. Gwenette Greet, diagnosed in 2013) who is oxygen dependent 3-8L presents to ED 12/6 with increasing dyspnea and hypoxia at home. Patient states that over the last 5 months she has experienced chest congestion, productive cough with thick yellowish, green sputum, and dyspnea. During this time has been seen by pulmonology where she was treated with oral antibiotics. While on the antibiotics the symptoms improve but then seem to return shortly after. Upon arrival to ED patient had titrate her home oxygen up to 15L, while in ED patient placed on high flow cannula.   Currently seen in pulmonary rehab. Followed at the [facility] center. VA echo showed preserved EF, PAP 30. [facility] also has seen in ILD clinic in 2017.  Takes cellcept and prednisone at home.   SUBJECTIVE:  Rested OK overnight Still has chest congestion this morning  VITAL SIGNS: BP 101/68 (BP Location: Right Arm)   Pulse 93   Temp 97.5 F (36.4 C) (Oral)   Resp (!) 30   Wt 153 lb 8 oz (69.6 kg)   SpO2 95%   BMI 29.00 kg/m   HEMODYNAMICS:    VENTILATOR SETTINGS: FiO2 (%):  [80 %-100 %] 90 %  INTAKE / OUTPUT: I/O last 3 completed shifts: In: 1180 [I.V.:300; Other:130; IV Piggyback:750] Out: 2020 [Urine:2020]  PHYSICAL EXAMINATION: General: mild respiratory distress in bed HENT: NCAT OP clear PULM: Crackles 1/2 way up bilaterally, no wheezing CV: Tachy, no mgr GI: BS+, soft, nontender Derm: no edema, acyanotic Neuro: Awake and alert, follows commands, moves all four extremities well   LABS:  BMET  Recent Labs Lab 11/03/2016 1945 10/12/16 0237  10/13/16 0226  NA 137 135 136  K 3.7 3.3* 3.7  CL 101 100* 100*  CO2 25 25 26   BUN 8 7 15   CREATININE 0.73 0.77 0.80  GLUCOSE 125* 140* 153*    Electrolytes  Recent Labs Lab 11/04/2016 1945 10/12/16 0237 10/13/16 0226  CALCIUM 9.6 8.9 8.7*  MG  --   --  2.2  PHOS  --   --  2.5    CBC  Recent Labs Lab 11/01/2016 1945 10/12/16 0237 10/13/16 0226  WBC 10.2 11.5* 10.3  HGB 11.6* 10.8* 11.2*  HCT 35.3* 32.3* 34.5*  PLT 287 246 317    Coag's No results for input(s): APTT, INR in the last 168 hours.  Sepsis Markers  Recent Labs Lab 10/12/16 1008 10/12/16 1615  LATICACIDVEN 0.8 1.2    ABG No results for input(s): PHART, PCO2ART, PO2ART in the last 168 hours.  Liver Enzymes No results for input(s): AST, ALT, ALKPHOS, BILITOT, ALBUMIN in the last 168 hours.  Cardiac Enzymes No results for input(s): TROPONINI, PROBNP in the last 168 hours.  Glucose  Recent Labs Lab 10/12/16 1205 10/12/16 1554 10/12/16 2015 10/12/16 2339 10/13/16 0346 10/13/16 0810  GLUCAP 157* 149* 111* 135* 129* 135*    Imaging 10/21/2016 CXR images personally reviewed showing bilateral airspace disease   STUDIES:  CXR 12/7 > Vascular fullness and diffuse interstitial thickening. Cardiomegaly.  12/7 Echo> LVEF normal, RVSP 49 mmHg  CULTURES: Urine 12/7> Sputum 12/7> few gram  neg rods and GPC in pairs Blood 12/7>   ANTIBIOTICS: Azithromycin 12/7 > Cefepime 12/7 >   SIGNIFICANT EVENTS: 12/7 > presents to ED with dyspnea   LINES/TUBES:   DISCUSSION: 50 year old  Female with underlying Fibrotic NSIP that has been progressive over the last 12 months despite periodic treatment with Cellcept and prednisone during that time. She has acute on chronic respiratory failure from HCAP after a recent viral prodrome.  Her underlying disease is severe and she is not a transplant candidate.    ASSESSMENT / PLAN:  PULMONARY A: Acute on Chronic Respiratory Failure secondary to  HCAP Fibrotic NSIP Pulmonary hypertension noted on 12/8 Echo> due to high O2 requirements P:   Supplemental oxygen for saturation > 88  Continue Albuterol PRN Continue Brovana BID  Continue solu-medrol 40mg  IV q6h  CARDIOVASCULAR A:  Doubt pulmonary edema P:  Cardiac Monitoring  Strict I & O   RENAL A:   Hypokalemia  P:   Monitor BMET and UOP Replace electrolytes as needed   GASTROINTESTINAL A:   No issues  P:   PPI Advance diet  HEMATOLOGIC A:   Mild anemia, not bleeding P:  Trend CBC   INFECTIOUS A:   HCAP  H/O rheumatic disease NOS  P:   Follow culture Continue azithromycin and cefepime   ENDOCRINE A:   Steroid related hyperglycemia  P:   SSI in setting on stress dose steroids  Q4H Glucose Checks  NEUROLOGIC A:   H/O anxiety  P:   Continue home Prozac  Continue home Neurontin  PRN ativan      FAMILY  - Updates: family at bedside and updated on plan at length by Glenette Bookwalter on 10/13/2016: made DNR, see note from 12/8  - Inter-disciplinary family meet or Palliative Care meeting due by: 12/14    Independent critical care time is 60 minutes.   Roselie Awkward, MD [place] [facility] Pager: [phone] Cell: [phone] After 3pm or if no response, call [phone]

## 2016-10-13 NOTE — Progress Notes (Signed)
RT NT suctioned patient without complications. 02sats dropped into low 70's. Patient recovered quickly. Currently sat is 92%. Family and RN at bedside. RT will continue to monitor.

## 2016-10-13 NOTE — Progress Notes (Signed)
[place] notified of pt admission; spoke with Juliann Pulse.    Pt is seen by PCP in the [facility].  PCP is Dr. Milas Hock, phone [phone].    Dr. Julius Bowels CSW is Lind Guest, phone [phone], ext [phone].  Please call CSW for any discharge needs.    Case manager will continue to follow progress.   Lionel December, RN, BSN [phone]

## 2016-10-14 DIAGNOSIS — D899 Disorder involving the immune mechanism, unspecified: Secondary | ICD-10-CM

## 2016-10-14 LAB — GLUCOSE, CAPILLARY
GLUCOSE-CAPILLARY: 125 mg/dL — AB (ref 65–99)
GLUCOSE-CAPILLARY: 142 mg/dL — AB (ref 65–99)
GLUCOSE-CAPILLARY: 130 mg/dL — AB (ref 65–99)
Glucose-Capillary: 146 mg/dL — ABNORMAL HIGH (ref 65–99)
Glucose-Capillary: 132 mg/dL — ABNORMAL HIGH (ref 65–99)

## 2016-10-14 LAB — CULTURE, RESPIRATORY W GRAM STAIN: Culture: NORMAL

## 2016-10-14 LAB — BASIC METABOLIC PANEL
ANION GAP: 11 (ref 5–15)
BUN: 17 mg/dL (ref 6–20)
CHLORIDE: 100 mmol/L — AB (ref 101–111)
CO2: 25 mmol/L (ref 22–32)
Calcium: 8.7 mg/dL — ABNORMAL LOW (ref 8.9–10.3)
Creatinine, Ser: 0.7 mg/dL (ref 0.44–1.00)
Glucose, Bld: 150 mg/dL — ABNORMAL HIGH (ref 65–99)
POTASSIUM: 3.9 mmol/L (ref 3.5–5.1)
SODIUM: 136 mmol/L (ref 135–145)

## 2016-10-14 LAB — CBC
HCT: 34.2 % — ABNORMAL LOW (ref 36.0–46.0)
HEMOGLOBIN: 11 g/dL — AB (ref 12.0–15.0)
MCH: 30.1 pg (ref 26.0–34.0)
MCHC: 32.2 g/dL (ref 30.0–36.0)
MCV: 93.7 fL (ref 78.0–100.0)
PLATELETS: 345 10*3/uL (ref 150–400)
RBC: 3.65 MIL/uL — AB (ref 3.87–5.11)
RDW: 12.7 % (ref 11.5–15.5)
WBC: 15.7 10*3/uL — AB (ref 4.0–10.5)

## 2016-10-14 MED ORDER — CODEINE SULFATE 15 MG PO TABS
15.0000 mg | ORAL_TABLET | Freq: Four times a day (QID) | ORAL | Status: DC | PRN
  Administered 2016-10-15: 15 mg via ORAL
  Filled 2016-10-14: qty 1

## 2016-10-14 MED ORDER — DEXTROSE 5 % IV SOLN
15.0000 mg/kg/d | Freq: Three times a day (TID) | INTRAVENOUS | Status: DC
  Administered 2016-10-14 – 2016-10-15 (×3): 350.4 mg via INTRAVENOUS
  Filled 2016-10-14 (×8): qty 21.9

## 2016-10-14 MED ORDER — LORAZEPAM 2 MG/ML IJ SOLN
0.5000 mg | Freq: Once | INTRAMUSCULAR | Status: AC
  Administered 2016-10-15: 0.5 mg via INTRAVENOUS
  Filled 2016-10-14: qty 1

## 2016-10-14 MED ORDER — BENZONATATE 100 MG PO CAPS
200.0000 mg | ORAL_CAPSULE | Freq: Two times a day (BID) | ORAL | Status: DC | PRN
  Administered 2016-10-15 (×2): 200 mg via ORAL
  Filled 2016-10-14 (×3): qty 2

## 2016-10-14 MED ORDER — ALPRAZOLAM 0.25 MG PO TABS
0.2500 mg | ORAL_TABLET | Freq: Three times a day (TID) | ORAL | Status: DC | PRN
Start: 2016-10-14 — End: 2016-10-16
  Administered 2016-10-14 – 2016-10-15 (×3): 0.25 mg via ORAL
  Filled 2016-10-14 (×3): qty 1

## 2016-10-14 MED ORDER — PHENOL 1.4 % MT LIQD
1.0000 | OROMUCOSAL | Status: DC | PRN
  Filled 2016-10-14: qty 177

## 2016-10-14 NOTE — Progress Notes (Signed)
Pharmacy Antibiotic Note Olivia [name] is a 50 y.o. female admitted on 10/23/2016 with acute on chronic hypoxic respiratory failure and concern for PCP PNA.  PMHx sx for ILD on chronic immunosuppression and bactrim PO for prophylaxis as an outpatient.  Pharmacy has been consulted for bactrim IV dosing.  Plan: 1. Bactrim 350 mg IV every 8 hours (15 mg/kg/day) 2. Follow renal function closely while on bactrim 3. Deescalate abx as feasible    Weight: 154 lb 8 oz (70.1 kg)  Temp (24hrs), Avg:97.8 F (36.6 C), Min:97.4 F (36.3 C), Max:98.4 F (36.9 C)   Recent Labs Lab 10/26/2016 1945 10/12/16 0237 10/12/16 1008 10/12/16 1615 10/13/16 0226 10/14/16 0152  WBC 10.2 11.5*  --   --  10.3 15.7*  CREATININE 0.73 0.77  --   --  0.80 0.70  LATICACIDVEN  --   --  0.8 1.2  --   --     Estimated Creatinine Clearance: 74.5 mL/min (by C-G formula based on SCr of 0.7 mg/dL).    Allergies  Allergen Reactions  . Darvocet [Propoxyphene N-Acetaminophen] Hives  . Hydrocodone Hives  . Oxycodone Hives  . Vicodin [Hydrocodone-Acetaminophen] Hives   Antimicrobials this admission:  12/7 Cefepime >>  12/7 Azithromycin  >>  12/9 Bactrim IV >>   Dose adjustments this admission:  N/a  Microbiology results:  12/7 BCx: ngtd 12/7 UCx: ngF  12/7 Sputum: nornal flora  12/7 MRSA PCR: negative  12/7 Influenza: neg  Thank you for allowing pharmacy to be a part of this patient's care.  Vincenza Hews, PharmD, BCPS 10/14/2016, 8:04 PM Pager: [phone]

## 2016-10-14 NOTE — Progress Notes (Signed)
[facility] Progress Note Patient Name: Olivia [name] DOB: 1965-12-03 MRN: [id number]   Date of Service  10/14/2016  HPI/Events of Note  Anxiety.  eICU Interventions  Will order Ativan 0.5 mg IV X 1 now (extra dose)/     Intervention Category Minor Interventions: Agitation / anxiety - evaluation and management  Sommer,Steven Eugene 10/14/2016, 10:27 PM

## 2016-10-14 NOTE — Progress Notes (Signed)
PULMONARY / CRITICAL CARE MEDICINE   Name: Olivia [name] MRN: [id number] DOB: 1966-01-29    ADMISSION DATE:  10/10/2016 CONSULTATION DATE:  10/12/2016  REFERRING MD:  Dr. Alvino Chapel   CHIEF COMPLAINT:  Dyspnea, hypoxia, productive cough   HISTORY OF PRESENT ILLNESS:    50 year old female with PMH of IBS, GERD, Cirrhosis, rheumatic disease NOS and non-specific ILD (followed by Dr. Gwenette Greet, diagnosed in 2013) who is oxygen dependent 3-8L presents to ED 12/6 with increasing dyspnea and hypoxia at home. Patient states that over the last 5 months she has experienced chest congestion, productive cough with thick yellowish, green sputum, and dyspnea. During this time has been seen by pulmonology where she was treated with oral antibiotics. While on the antibiotics the symptoms improve but then seem to return shortly after. Upon arrival to ED patient had titrate her home oxygen up to 15L, while in ED patient placed on high flow cannula.   Currently seen in pulmonary rehab. Followed at the [facility] center. [facility] echo showed preserved EF, PAP 30. [facility] also has seen in ILD clinic in 2017.  Takes cellcept and prednisone at home.   SUBJECTIVE:  O2 sats stable on NRB /[place] , however with any movement O2 sats drop.      VITAL SIGNS: BP (!) 87/65   Pulse 85   Temp 97.7 F (36.5 C) (Oral)   Resp (!) 22   Wt 70.1 kg (154 lb 8 oz)   SpO2 95%   BMI 29.19 kg/m   HEMODYNAMICS:    VENTILATOR SETTINGS: FiO2 (%):  [80 %-100 %] 100 %  INTAKE / OUTPUT: I/O last 3 completed shifts: In: 1060 [Other:310; IV Piggyback:750] Out: [id number] [Urine:1070]  PHYSICAL EXAMINATION: General: mild respiratory distress in bed HENT: NCAT OP clear PULM: Crackles 1/2 way up bilaterally, no wheezing CV: Tachy, no mgr GI: BS+, soft, nontender Derm: no edema, acyanotic Neuro: Awake and alert, follows commands, moves all four extremities well   LABS:  BMET  Recent Labs Lab 10/12/16 0237 10/13/16 0226 10/14/16 0152  NA 135  136 136  K 3.3* 3.7 3.9  CL 100* 100* 100*  CO2 25 26 25   BUN 7 15 17   CREATININE 0.77 0.80 0.70  GLUCOSE 140* 153* 150*    Electrolytes  Recent Labs Lab 10/12/16 0237 10/13/16 0226 10/14/16 0152  CALCIUM 8.9 8.7* 8.7*  MG  --  2.2  --   PHOS  --  2.5  --     CBC  Recent Labs Lab 10/12/16 0237 10/13/16 0226 10/14/16 0152  WBC 11.5* 10.3 15.7*  HGB 10.8* 11.2* 11.0*  HCT 32.3* 34.5* 34.2*  PLT 246 317 345    Coag's No results for input(s): APTT, INR in the last 168 hours.  Sepsis Markers  Recent Labs Lab 10/12/16 1008 10/12/16 1615  LATICACIDVEN 0.8 1.2    ABG No results for input(s): PHART, PCO2ART, PO2ART in the last 168 hours.  Liver Enzymes No results for input(s): AST, ALT, ALKPHOS, BILITOT, ALBUMIN in the last 168 hours.  Cardiac Enzymes No results for input(s): TROPONINI, PROBNP in the last 168 hours.  Glucose  Recent Labs Lab 10/13/16 1600 10/13/16 2014 10/13/16 2329 10/14/16 0333 10/14/16 0850 10/14/16 1246  GLUCAP 207* 134* 138* 125* 142* 146*    Imaging 10/22/2016 CXR images personally reviewed showing bilateral airspace disease   STUDIES:  CXR 12/7 > Vascular fullness and diffuse interstitial thickening. Cardiomegaly.  12/7 Echo> LVEF normal, RVSP 49 mmHg  CULTURES: Urine  12/7> Sputum 12/7> few gram neg rods and GPC in pairs Blood 12/7>   ANTIBIOTICS: Azithromycin 12/7 > Cefepime 12/7 >   SIGNIFICANT EVENTS: 12/7 > presents to ED with dyspnea   LINES/TUBES:   DISCUSSION: 50 year old  Female with underlying Fibrotic NSIP that has been progressive over the last 12 months despite periodic treatment with Cellcept and prednisone during that time. She has acute on chronic respiratory failure from HCAP after a recent viral prodrome.  Her underlying disease is severe and she is not a transplant candidate.    ASSESSMENT / PLAN:  PULMONARY A: Acute on Chronic Respiratory Failure secondary to HCAP Fibrotic  NSIP Pulmonary hypertension noted on 12/8 Echo> due to high O2 requirements P:   Supplemental oxygen for saturation > 88  Continue Albuterol PRN Continue Brovana BID  Continue solu-medrol 40mg  IV q6h  CARDIOVASCULAR A:  Doubt pulmonary edema P:  Cardiac Monitoring  Strict I & O   RENAL A:   Hypokalemia  P:   Monitor BMET and UOP Replace electrolytes as needed   GASTROINTESTINAL A:   No issues  P:   PPI Advance diet  HEMATOLOGIC A:   Mild anemia, not bleeding P:  Trend CBC   INFECTIOUS A:   HCAP  H/O rheumatic disease NOS  P:   Follow culture Continue azithromycin and cefepime   ENDOCRINE A:   Steroid related hyperglycemia  P:   SSI in setting on stress dose steroids  Q4H Glucose Checks  NEUROLOGIC A:   H/O anxiety  P:   Continue home Prozac  Continue home Neurontin  PRN ativan      FAMILY  - Updates: family at bedside and updated on plan at length by McQuaid on 10/13/2016: made DNR, see note from 12/8  - Inter-disciplinary family meet or Palliative Care meeting due by: 12/14   Crimson Beer NP-C  [facility]  [phone]   10/14/2016

## 2016-10-14 NOTE — Progress Notes (Signed)
[facility] Progress Note Patient Name: Olivia [name] DOB: 11/25/1965 MRN: [id number]   Date of Service  10/14/2016  HPI/Events of Note  Persistent cough despite 100 mg tessalon.  Patient states she takes 200 mg at home. C/O of pain.  Takes codeine 10 mg q6 hours at home despite the allergy listing of hives for hydrocodone.  eICU Interventions  Plan: 200 mg tessalon ordered  Codeine 15 mg po q6 hours prn pain     Intervention Category Intermediate Interventions: Pain - evaluation and management;Other:  DETERDING,ELIZABETH 10/14/2016, 11:31 PM

## 2016-10-15 DIAGNOSIS — J189 Pneumonia, unspecified organism: Principal | ICD-10-CM

## 2016-10-15 LAB — RENAL FUNCTION PANEL
Albumin: 2.7 g/dL — ABNORMAL LOW (ref 3.5–5.0)
Anion gap: 9 (ref 5–15)
BUN: 14 mg/dL (ref 6–20)
CO2: 27 mmol/L (ref 22–32)
Calcium: 8.6 mg/dL — ABNORMAL LOW (ref 8.9–10.3)
Chloride: 101 mmol/L (ref 101–111)
Creatinine, Ser: 0.68 mg/dL (ref 0.44–1.00)
GFR calc Af Amer: >60 mL/min (ref >60)
GFR calc non Af Amer: >60 mL/min (ref >60)
Glucose, Bld: 122 mg/dL — ABNORMAL HIGH (ref 65–99)
Phosphorus: 2.9 mg/dL (ref 2.5–4.6)
Potassium: 4.1 mmol/L (ref 3.5–5.1)
Sodium: 137 mmol/L (ref 135–145)

## 2016-10-15 LAB — GLUCOSE, CAPILLARY
GLUCOSE-CAPILLARY: 131 mg/dL — AB (ref 65–99)
GLUCOSE-CAPILLARY: 143 mg/dL — AB (ref 65–99)
GLUCOSE-CAPILLARY: 166 mg/dL — AB (ref 65–99)
GLUCOSE-CAPILLARY: 191 mg/dL — AB (ref 65–99)
Glucose-Capillary: 103 mg/dL — ABNORMAL HIGH (ref 65–99)
Glucose-Capillary: 122 mg/dL — ABNORMAL HIGH (ref 65–99)

## 2016-10-15 LAB — CBC WITH DIFFERENTIAL/PLATELET
Basophils Absolute: 0 10*3/uL (ref 0.0–0.1)
Basophils Relative: 0 %
EOS ABS: 0 10*3/uL (ref 0.0–0.7)
EOS PCT: 0 %
HCT: 34.4 % — ABNORMAL LOW (ref 36.0–46.0)
Hemoglobin: 11 g/dL — ABNORMAL LOW (ref 12.0–15.0)
LYMPHS ABS: 0.8 10*3/uL (ref 0.7–4.0)
Lymphocytes Relative: 7 %
MCH: 29.7 pg (ref 26.0–34.0)
MCHC: 32 g/dL (ref 30.0–36.0)
MCV: 93 fL (ref 78.0–100.0)
MONO ABS: 0.5 10*3/uL (ref 0.1–1.0)
MONOS PCT: 4 %
Neutro Abs: 10.3 10*3/uL — ABNORMAL HIGH (ref 1.7–7.7)
Neutrophils Relative %: 89 %
PLATELETS: 289 10*3/uL (ref 150–400)
RBC: 3.7 MIL/uL — AB (ref 3.87–5.11)
RDW: 12.7 % (ref 11.5–15.5)
WBC: 11.5 10*3/uL — AB (ref 4.0–10.5)

## 2016-10-15 LAB — MAGNESIUM: Magnesium: 2.4 mg/dL (ref 1.7–2.4)

## 2016-10-15 MED ORDER — ACETAMINOPHEN 160 MG/5ML PO SOLN: 650.0000 mg | Freq: Four times a day (QID) | ORAL | Status: DC | PRN

## 2016-10-15 MED ORDER — NYSTATIN 100000 UNIT/ML MT SUSP
5.0000 mL | Freq: Four times a day (QID) | OROMUCOSAL | Status: DC
  Administered 2016-10-15: 500000 [IU] via OROMUCOSAL
  Filled 2016-10-15: qty 5

## 2016-10-15 MED ORDER — GUAIFENESIN 100 MG/5ML PO SOLN
10.0000 mL | ORAL | Status: DC | PRN
  Administered 2016-10-15: 200 mg
  Filled 2016-10-15: qty 5

## 2016-10-15 MED ORDER — FENTANYL CITRATE (PF) 2500 MCG/50ML IJ SOLN
100.0000 ug/h | INTRAMUSCULAR | Status: DC
  Administered 2016-10-15 – 2016-10-16 (×2): 100 ug/h via INTRAVENOUS
  Filled 2016-10-15 (×2): qty 50

## 2016-10-15 MED ORDER — MORPHINE SULFATE (PF) 2 MG/ML IV SOLN
2.0000 mg | Freq: Once | INTRAVENOUS | Status: AC
  Administered 2016-10-15: 2 mg via INTRAVENOUS

## 2016-10-15 MED ORDER — LORAZEPAM 2 MG/ML IJ SOLN
0.5000 mg | Freq: Once | INTRAMUSCULAR | Status: AC
  Administered 2016-10-15: 0.5 mg via INTRAVENOUS

## 2016-10-15 MED ORDER — ALPRAZOLAM 0.5 MG PO TABS
0.5000 mg | ORAL_TABLET | Freq: Two times a day (BID) | ORAL | Status: DC
  Filled 2016-10-15: qty 1

## 2016-10-15 MED ORDER — MORPHINE SULFATE (PF) 2 MG/ML IV SOLN
INTRAVENOUS | Status: AC
  Filled 2016-10-15: qty 1

## 2016-10-15 MED ORDER — FENTANYL BOLUS VIA INFUSION
50.0000 ug | INTRAVENOUS | Status: DC | PRN
  Administered 2016-10-16 (×2): 150 ug via INTRAVENOUS
  Administered 2016-10-16 (×2): 100 ug via INTRAVENOUS
  Administered 2016-10-16: 150 ug via INTRAVENOUS
  Administered 2016-10-16: 100 ug via INTRAVENOUS
  Administered 2016-10-16: 150 ug via INTRAVENOUS
  Administered 2016-10-16: 50 ug via INTRAVENOUS
  Administered 2016-10-16: 100 ug via INTRAVENOUS
  Administered 2016-10-16: 150 ug via INTRAVENOUS
  Administered 2016-10-16: 100 ug via INTRAVENOUS
  Administered 2016-10-16: 50 ug via INTRAVENOUS
  Filled 2016-10-15: qty 200

## 2016-10-15 NOTE — Progress Notes (Signed)
eLink Physician-Brief Progress Note Patient Name: Olivia [name] DOB: 1966/09/14 MRN: [id number]   Date of Service  10/15/2016  HPI/Events of Note  Thick secretions.  eICU Interventions  Will order Guaifenesin Solution 200 mg per tube Q 4 hours PRN.      Intervention Category Intermediate Interventions: Other:  Lysle Dingwall 10/15/2016, 7:45 PM

## 2016-10-15 NOTE — Progress Notes (Addendum)
Upon entering pts room pt noted to be in tripod position, tachycardic 120-130's, breathing 40-50 bpm with oxygen saturation in the 60-70's. Respiratory notified. HFNC increased to 40%. Pt refuses to try BiPAP at this time. Dr. Ashok Cordia notified, order for Ativan 0.5 mg received. Will continue to monitor closely.

## 2016-10-15 NOTE — Progress Notes (Signed)
[facility] Progress Note Patient Name: Olivia [name] DOB: 12-19-1965 MRN: [id number]   Date of Service  10/15/2016  HPI/Events of Note  Patient has deteriorated significantly and is struggling to breath.  Hx of IPF. Patient is already a DNR/DNI. Family wants to move to comfort measures. Spoke with Jackalyn Lombard, the patient's brother who is also POA, he affirms that the patient and the family desire to move to comfort measures and allow the patient to pass away with comfort and dignity.   eICU Interventions  Will make the patient comfort measures and implement withdrawal of life sustaining measures protocol.      Intervention Category Major Interventions: End of life / care limitation discussion  Lysle Dingwall 10/15/2016, 9:08 PM

## 2016-10-15 NOTE — Progress Notes (Signed)
Clubbing at fingertips

## 2016-10-15 NOTE — Progress Notes (Signed)
   10/15/16 2100  Clinical Encounter Type  Visited With Family  Visit Type Patient actively dying  Referral From Nurse  Spiritual Encounters  Spiritual Needs Grief support;Emotional  Stress Factors  Patient Stress Factors Major life changes  CH called to support patient and family as pt transitioned to comfort care. CH to contact on call [place] priest for anointing of the sick.

## 2016-10-15 NOTE — Progress Notes (Signed)
PULMONARY / CRITICAL CARE MEDICINE   Name: Olivia [name] MRN: [id number] DOB: 01/07/1966    ADMISSION DATE:  10/29/2016 CONSULTATION DATE:  10/12/2016  REFERRING MD:  Dr. Alvino Chapel   CHIEF COMPLAINT:  Dyspnea, hypoxia, productive cough   HISTORY OF PRESENT ILLNESS:    50 year old female with PMH of IBS, GERD, Cirrhosis, rheumatic disease NOS and non-specific ILD (followed by Dr. Gwenette Greet, diagnosed in 2013) who is oxygen dependent 3-8L presents to ED 12/6 with increasing dyspnea and hypoxia at home. Patient states that over the last 5 months she has experienced chest congestion, productive cough with thick yellowish, green sputum, and dyspnea. During this time has been seen by pulmonology where she was treated with oral antibiotics. While on the antibiotics the symptoms improve but then seem to return shortly after. Upon arrival to ED patient had titrate her home oxygen up to 15L, while in ED patient placed on high flow cannula.   Currently seen in pulmonary rehab. Followed at the [facility] center. [facility] echo showed preserved EF, PAP 30. [facility] also has seen in ILD clinic in 2017.  Takes cellcept and prednisone at home.   SUBJECTIVE: Patient did have significant anxiety this morning requiring IV Ativan. Currently dyspneic. Denies any headache or vision changes.   REVIEW OF SYSTEMS:  Patient denies any subjective fever or chills. Denies abdominal pain or nausea. Denies any chest pain or pressure.   VITAL SIGNS: BP 107/80 (BP Location: Right Arm)   Pulse 96   Temp 98.3 F (36.8 C) (Oral)   Resp (!) 34   Wt 154 lb 15.7 oz (70.3 kg)   SpO2 (!) 86%   BMI 29.28 kg/m   HEMODYNAMICS:    VENTILATOR SETTINGS: FiO2 (%):  [100 %] 100 %  INTAKE / OUTPUT: I/O last 3 completed shifts: In: 1751.9 [P.O.:300; Other:180; IV Piggyback:1271.9] Out: 1740 [id number]  PHYSICAL EXAMINATION: General:  Awake. Alert. Mild respiratory distress. Family at bedside.  Integument:  Warm & dry. No rash on exposed skin.   HEENT:  Dry mucus membranes. No oral ulcers. No scleral injection.  Cardiovascular:  Regular rate. No edema. No appreciable JVD.  Pulmonary:  Crackles bilaterally unchanged. Increased WOB.  Abdomen: Soft. Normal bowel sounds. Nondistended. Grossly nontender. Musculoskeletal:  Normal bulk and tone. No joint deformity or effusion appreciated.  LABS:  BMET  Recent Labs Lab 10/13/16 0226 10/14/16 0152 10/15/16 0506  NA 136 136 137  K 3.7 3.9 4.1  CL 100* 100* 101  CO2 26 25 27   BUN 15 17 14   CREATININE 0.80 0.70 0.68  GLUCOSE 153* 150* 122*    Electrolytes  Recent Labs Lab 10/13/16 0226 10/14/16 0152 10/15/16 0506  CALCIUM 8.7* 8.7* 8.6*  MG 2.2  --  2.4  PHOS 2.5  --  2.9    CBC  Recent Labs Lab 10/13/16 0226 10/14/16 0152 10/15/16 0506  WBC 10.3 15.7* 11.5*  HGB 11.2* 11.0* 11.0*  HCT 34.5* 34.2* 34.4*  PLT 317 345 289    Coag's No results for input(s): APTT, INR in the last 168 hours.  Sepsis Markers  Recent Labs Lab 10/12/16 1008 10/12/16 1615  LATICACIDVEN 0.8 1.2    ABG No results for input(s): PHART, PCO2ART, PO2ART in the last 168 hours.  Liver Enzymes  Recent Labs Lab 10/15/16 0506  ALBUMIN 2.7*    Cardiac Enzymes No results for input(s): TROPONINI, PROBNP in the last 168 hours.  Glucose  Recent Labs Lab 10/14/16 1615 10/14/16 2015 10/15/16 0009 10/15/16  0350 10/15/16 0848 10/15/16 1207  GLUCAP 132* 130* 191* 103* 122* 131*    Imaging 10/27/2016 CXR images personally reviewed showing bilateral airspace disease   STUDIES:  TTE 12/7: LVEF normal, RVSP 49 mmHg  MICROBIOLOGY: Urine 12/7:  Negative  Sputum 12/7:  Oral flora Urine Strep Ag 12/6:  Negative Urine Legionella Ag 12/6:  Negative Influenza A PCR 12/7:  Negative  Blood Ctx x2 12/7 >>  ANTIBIOTICS: Azithromycin 12/7 > Cefepime 12/7 > Bactrim 12/9 >>  SIGNIFICANT EVENTS: 12/7 - presents to ED with dyspnea   LINES/TUBES: PIV  ASSESSMENT / PLAN:   50 y.o. female with known underlying fibrotic NSIP. Chronic hypoxic respiratory failure. Admitted with acute on chronic hypoxic respiratory failure concerning for ILD flare versus healthcare associated pneumonia. Patient has been limited clinical progress. Started on PCP treatment yesterday.  1. Acute on chronic hypoxic respiratory failure: No significant improvement. Continuing to wean FiO2. Continuing Solu-Medrol 40 mg IV every 6 hours & CellCept 1000 mg by mouth twice a day. 2. Possible HCAP Versus ILD flare: Continuing Solu-Medrol IV every 6 hours. Empiric antibiotics with cefepime & azithromycin. Awaiting culture results.Sputum PCP DFA & empiric treatment with Bactrim per pharmacy dosing. Beta D Glucan pending.  3. Hypokalemia: Resolved. 4. Hyperglycemia: Secondary to steroids. Continuing sliding scale insulin with Accu-Cheks every 4 hours. 5. Anxiety: Continuing Prozac & Neurontin. Continuing Ativan IV as needed. Xanax 0.25mg  TID prn & 0.5mg  bid scheduled. 6. Prophylaxis: Protonix by mouth daily, SCDs & Lovenox subcutaneous every 24 hours. 7. Disposition: Transitioning patient to stepdown unit bed.  Patient will remain on PCCM service.  FAMILY  - Updates: family at bedside and updated on plan at length by McQuaid on 10/13/2016: made DNR, see note from 12/8  - Inter-disciplinary family meet or Palliative Care meeting due by: 12/14   Sonia Baller. Ashok Cordia, M.D. [facility] Pager:  [phone] After 3pm or if no response, call [phone] 1:08 PM 10/15/2016

## 2016-10-15 NOTE — Progress Notes (Signed)
Pt noted to be extremely anxious, breathing 40-50 bpm, heart rate 138, oxygen saturation 50%. Nickolas Madrid, NP notified. 2 mg morphine ordered and given.

## 2016-10-16 DIAGNOSIS — Z515 Encounter for palliative care: Secondary | ICD-10-CM

## 2016-10-16 MED ORDER — BIOTENE DRY MOUTH MT LIQD: 15.0000 mL | OROMUCOSAL | Status: DC | PRN

## 2016-10-16 MED ORDER — LORAZEPAM 2 MG/ML IJ SOLN
0.5000 mg | INTRAMUSCULAR | Status: DC | PRN
  Administered 2016-10-16 (×2): 0.5 mg via INTRAVENOUS
  Filled 2016-10-16: qty 1

## 2016-10-16 MED ORDER — HALOPERIDOL LACTATE 5 MG/ML IJ SOLN: 0.5000 mg | INTRAMUSCULAR | Status: DC | PRN

## 2016-10-16 MED ORDER — LORAZEPAM 2 MG/ML IJ SOLN: 1.0000 mg | INTRAMUSCULAR | Status: DC | PRN

## 2016-10-16 MED ORDER — HALOPERIDOL 0.5 MG PO TABS: 0.5000 mg | ORAL_TABLET | ORAL | Status: DC | PRN

## 2016-10-16 MED ORDER — GLYCOPYRROLATE 1 MG PO TABS: 1.0000 mg | ORAL_TABLET | ORAL | Status: DC | PRN

## 2016-10-16 MED ORDER — GLYCOPYRROLATE 0.2 MG/ML IJ SOLN: 0.2000 mg | INTRAMUSCULAR | Status: DC | PRN

## 2016-10-16 MED ORDER — METHYLPREDNISOLONE SODIUM SUCC 40 MG IJ SOLR
40.0000 mg | Freq: Four times a day (QID) | INTRAMUSCULAR | Status: DC
Start: 2016-10-16 — End: 2016-10-16
  Administered 2016-10-16: 40 mg via INTRAVENOUS
  Filled 2016-10-16: qty 1

## 2016-10-16 MED ORDER — LORATADINE 10 MG PO TABS
10.0000 mg | ORAL_TABLET | Freq: Once | ORAL | Status: AC
  Administered 2016-10-16: 10 mg via ORAL
  Filled 2016-10-16: qty 1

## 2016-10-16 MED ORDER — FAMOTIDINE IN NACL 20-0.9 MG/50ML-% IV SOLN
20.0000 mg | Freq: Once | INTRAVENOUS | Status: AC
  Administered 2016-10-16: 20 mg via INTRAVENOUS
  Filled 2016-10-16: qty 50

## 2016-10-16 MED ORDER — POLYVINYL ALCOHOL 1.4 % OP SOLN: 1.0000 [drp] | Freq: Four times a day (QID) | OPHTHALMIC | Status: DC | PRN

## 2016-10-16 MED ORDER — HALOPERIDOL LACTATE 2 MG/ML PO CONC: 0.5000 mg | ORAL | Status: DC | PRN

## 2016-10-17 ENCOUNTER — Encounter (HOSPITAL_COMMUNITY): Payer: No Typology Code available for payment source

## 2016-10-17 ENCOUNTER — Telehealth: Payer: Self-pay

## 2016-10-17 LAB — CULTURE, BLOOD (ROUTINE X 2)
CULTURE: NO GROWTH
Culture: NO GROWTH

## 2016-10-17 NOTE — Telephone Encounter (Signed)
On 10/17/2016 I received a death certificate from [place] (original). The death certificate is for cremation. The patient is a patient of Doctor Nelda Marseille. The death certificate will be taken to [facility] tomorrow am for signature.  On 2016-11-14 I received the death certificate back from Doctor Nelda Marseille. I got the death certificate ready and called the funeral home to let them know the death certificate is ready for pickup. I also faxed a copy to the funeral home per the funeral home request.

## 2016-10-18 ENCOUNTER — Telehealth: Payer: Self-pay | Admitting: Internal Medicine

## 2016-10-18 NOTE — Telephone Encounter (Signed)
Dr  Gwenette Greet - her primary pulm at [facility] called to inquire status. Per chart review patien Olivia [name] of [place] [id number]  of 07-02-1966 died. Gave him the news  Dr. Brand Males, M.D., [facility].C.P Pulmonary and Critical Care Medicine Staff Physician [facility] Pager: [phone], If no answer or between  15:00h - 7:00h: call [phone]  10/18/2016 8:44 AM

## 2016-10-18 NOTE — Telephone Encounter (Signed)
Dr  Gwenette Greet - her primary pulm at [facility] called to inquire status. Per chart review patien Olivia [name] of [place] [id number]  of [birth year] died. Gave him the news  Dr. Brand Males, M.D., [facility].C.P Pulmonary and Critical Care Medicine Staff Physician [facility] Pager: [phone], If no answer or between  15:00h - 7:00h: call [phone]  10/18/2016 8:44 AM

## 2016-10-19 ENCOUNTER — Encounter (HOSPITAL_COMMUNITY): Payer: No Typology Code available for payment source

## 2016-10-24 ENCOUNTER — Encounter (HOSPITAL_COMMUNITY): Payer: No Typology Code available for payment source

## 2016-10-25 LAB — MISC LABCORP TEST (SEND OUT): Labcorp test code: 284526

## 2016-10-26 ENCOUNTER — Encounter (HOSPITAL_COMMUNITY): Payer: No Typology Code available for payment source

## 2016-10-31 ENCOUNTER — Encounter (HOSPITAL_COMMUNITY): Payer: No Typology Code available for payment source

## 2016-11-02 ENCOUNTER — Encounter (HOSPITAL_COMMUNITY): Payer: No Typology Code available for payment source

## 2016-11-06 NOTE — Progress Notes (Signed)
Patient very anxious, 02 sats 82%, patient struggling to breath. Took the non-rebreather mask off to get a sip of water and sats dropped into the 60's and sustained for at least 15 minutes. Nurse couching patient to breath in through her nose and blow out through her mouth but was too overcome by anxiety to stop panting. Patient is obvious distress. Nurse approached patient brother and sister Jenny Reichmann in the hall and asked what the plan of care for her was. Previous shift nurse reported family didn't want her to have anything sedating. Both her siblings blurted out "we just want her to be comfortable!" Nurse suggested giving Morphine for SOB. The patients brother called Remo Lipps the [facility] and asked him if it was okay and they all talked and agreed she should be comfort care. Patients' daughter, [place] at bedside with patient and her aunt and uncle asked her to come out into the hallway. They told her it was time to make her comfort care. [place] was at first distraught and had questions but then was okay.   ELink notified of families wishes and Dr. Emmit Alexanders called Ketur[facility] Barre and confirmed their wishes. Dr. Emmit Alexanders called me back and told me the plan of care. Nurse relayed plan of care to family and patient. Patient seemed much better. Significantly less anxiety.

## 2016-11-06 NOTE — Progress Notes (Signed)
[facility] Progress Note Patient Name: Olivia [name] DOB: 31-Dec-1965 MRN: [id number]   Date of Service  November 13, 2016  HPI/Events of Note  Itching on fentanyl in absence rash or other sx  eICU Interventions  Will continue the fentanyl given its effectiveness. Continue solumedrol, add loratadine and pepcid x 1      Intervention Category Minor Interventions: Other:  Olivia [name] S. 2016-11-13, 1:29 AM

## 2016-11-06 NOTE — Progress Notes (Signed)
Patient took a drink of water, began coughing, and then was unresponsive and turned blue for 15-20 seconds. She then gasped for air and began labored breathing again.  Daughter Olivia [name] and sister Olivia [name] at bedside and was very distraught by it.  Daughter felt it looked painful and was uncomfortable with her mom being in pain when she passed. Patient had no recollection of it and didn't even know it happened. Nurse reassured Olivia [name] that she would not be in pain through this process at all. Olivia [name] calmed down and refocused on talking to the patient and keeping her calm.

## 2016-11-06 NOTE — Progress Notes (Signed)
PULMONARY / CRITICAL CARE MEDICINE   Name: Olivia [name] MRN: [id number] DOB: [birth year]    ADMISSION DATE:  10/06/2016 CONSULTATION DATE:  10/12/2016  REFERRING MD:  Dr. Alvino Chapel   CHIEF COMPLAINT:  Dyspnea, hypoxia, productive cough   HISTORY OF PRESENT ILLNESS:    51 year old female with PMH of IBS, GERD, Cirrhosis, rheumatic disease NOS and non-specific ILD (followed by Dr. Gwenette Greet, diagnosed in 2013) who is oxygen dependent 3-8L presents to ED 12/6 with increasing dyspnea and hypoxia at home. Patient states that over the last 5 months she has experienced chest congestion, productive cough with thick yellowish, green sputum, and dyspnea. During this time has been seen by pulmonology where she was treated with oral antibiotics. While on the antibiotics the symptoms improve but then seem to return shortly after. Upon arrival to ED patient had titrate her home oxygen up to 15L, while in ED patient placed on high flow cannula.   Currently seen in pulmonary rehab. Followed at the [facility] center. VA echo showed preserved EF, PAP 30. [facility] also has seen in ILD clinic in 2017.  Takes cellcept and prednisone at home.   SUBJECTIVE: Made comfort care overnight, appears comfortable on fentanyl drip  VITAL SIGNS: BP 120/72   Pulse (!) 113   Temp 97.8 F (36.6 C) (Axillary)   Resp 11   Wt 70.3 kg (154 lb 15.7 oz)   SpO2 (!) 85%   BMI 29.28 kg/m   HEMODYNAMICS:    VENTILATOR SETTINGS: FiO2 (%):  [100 %] 100 %  INTAKE / OUTPUT: I/O last 3 completed shifts: In: 1876.3 [P.O.:300; I.V.:92.5; Other:40; IV Piggyback:1443.8] Out: 2310 [Urine:2310]  PHYSICAL EXAMINATION: General:  Unresponsive, on fentanyl drip. Integument:  Warm & dry. No rash on exposed skin.  HEENT:  Dry mucus membranes. No oral ulcers. No scleral injection.  Cardiovascular:  Regular rate. No edema. No appreciable JVD.  Pulmonary:  Diffuse crackles Abdomen: Soft. Normal bowel sounds. Nondistended. Grossly  nontender. Musculoskeletal:  Normal bulk and tone. No joint deformity or effusion appreciated.  LABS:  BMET  Recent Labs Lab 10/13/16 0226 10/14/16 0152 10/15/16 0506  NA 136 136 137  K 3.7 3.9 4.1  CL 100* 100* 101  CO2 26 25 27   BUN 15 17 14   CREATININE 0.80 0.70 0.68  GLUCOSE 153* 150* 122*    Electrolytes  Recent Labs Lab 10/13/16 0226 10/14/16 0152 10/15/16 0506  CALCIUM 8.7* 8.7* 8.6*  MG 2.2  --  2.4  PHOS 2.5  --  2.9    CBC  Recent Labs Lab 10/13/16 0226 10/14/16 0152 10/15/16 0506  WBC 10.3 15.7* 11.5*  HGB 11.2* 11.0* 11.0*  HCT 34.5* 34.2* 34.4*  PLT 317 345 289    Coag's No results for input(s): APTT, INR in the last 168 hours.  Sepsis Markers  Recent Labs Lab 10/12/16 1008 10/12/16 1615  LATICACIDVEN 0.8 1.2    ABG No results for input(s): PHART, PCO2ART, PO2ART in the last 168 hours.  Liver Enzymes  Recent Labs Lab 10/15/16 0506  ALBUMIN 2.7*    Cardiac Enzymes No results for input(s): TROPONINI, PROBNP in the last 168 hours.  Glucose  Recent Labs Lab 10/15/16 0009 10/15/16 0350 10/15/16 0848 10/15/16 1207 10/15/16 1603 10/15/16 1928  GLUCAP 191* 103* 122* 131* 143* 166*    Imaging 10/23/2016 CXR images personally reviewed showing bilateral airspace disease   STUDIES:  TTE 12/7: LVEF normal, RVSP 49 mmHg  MICROBIOLOGY: Urine 12/7:  Negative  Sputum 12/7:  Oral flora Urine Strep Ag 12/6:  Negative Urine Legionella Ag 12/6:  Negative Influenza A PCR 12/7:  Negative  Blood Ctx x2 12/7 >>  ANTIBIOTICS: Azithromycin 12/7 > Cefepime 12/7 > Bactrim 12/9 >>  SIGNIFICANT EVENTS: 12/7 - presents to ED with dyspnea   LINES/TUBES: PIV  ASSESSMENT / PLAN:  51 y.o. female with known underlying fibrotic NSIP. Chronic hypoxic respiratory failure. Admitted with acute on chronic hypoxic respiratory failure concerning for ILD flare versus healthcare associated pneumonia. Patient has been limited clinical  progress. Started on PCP treatment yesterday.  Now comfort care.  1. D/C Solu-Medrol 40 mg IV every 6 hours & CellCept 1000 mg by mouth twice a day. 2. D/C abx 3. D/C blood draws 4. D/C CBGs 5. Fentanyl drip for comfort 6. Palliative care consult  Discussed with palliative care NP.  FAMILY  - Updates: May transfer to palliative care floor.  Rush Farmer, M.D. [facility] Pulmonary/Critical Care Medicine. Pager: [phone]. After hours pager: [phone].  10:47 AM 10/24/2016

## 2016-11-06 NOTE — Progress Notes (Signed)
Remaining 240 mL of fentanyl gtt wasted down sink with Casandra Doffing RN.

## 2016-11-06 NOTE — Progress Notes (Signed)
Called eLink to ask for Pepcid and Zyrtec for the itching probably related to the Fentanyl drip. Spoke with Dr. Lamonte Sakai who said he would order them. Will administer as ordered and continue to monitor.

## 2016-11-06 NOTE — Progress Notes (Signed)
Wasted 64ml of fentanyl gtt with melody wright. New bag hung.

## 2016-11-06 NOTE — Progress Notes (Signed)
All immediate family members came and went except her daughter [place], sister Jenny Reichmann, and Eulas Post' 2 daughters. Patient was happy and being silly. Family was doing her hair. They were all enjoying each others company and glad to see the patient more like herself again. [place] expressed her gratitude for helping her mom not suffer any more and how she agreed this was the best thing for her.

## 2016-11-06 NOTE — Progress Notes (Signed)
CSW consulted regarding grief counseling. CSW left grief counseling resources on patient's chart since patient's daughter was not in the room.  Please call CSW if daughter returns.  Percell Locus Renwick Asman LCSWA [phone]

## 2016-11-06 NOTE — Consult Note (Signed)
Consultation Note Date: 11-12-2016   Patient Name: Olivia [name]  DOB: [birth year]  MRN: [id number]  Age / Sex: 51 y.o., female  PCP: Cari Caraway, MD Referring Physician: Collene Gobble, MD  Reason for Consultation: Non pain symptom management and Terminal Care  HPI/Patient Profile: 51 y.o. female  with past medical history of interstitial lung disease, rhematic disease, admitted on 10/29/2016 with hypoxia and dyspnea. Interstitial lung disease diagnosed in 2013 with worsening over the last year. Significant decline in the last two weeks in resp status and function. Patient and family transitioned to comfort measures on 10/15/16. Palliative medicine consulted for symptom management and end of life care.   Clinical Assessment and Goals of Care: Patient in bed on NRB and high flow [place], surrounded by multiple family members. She appears to be actively dying with end of life breathing pattern. I spoke with her sister and her daughter. Patient appears comfortable on fentanyl drip and using fentanyl bolus. Discussed breathing patterns at end of life. Also discussed that oxygen therapy is prolonging life and is not providing comfort at this point. Family agrees to d/c NRB and decrease [place]. Their goal is to allow patient to continue dying a peaceful natural death, in comfort.    Primary Decision Maker NEXT OF KIN - patient's daughter    SUMMARY OF RECOMMENDATIONS -D/C NRB -D/C all medications that are not providing comfort -D/C o2 sat and vital signs unless family requests -D/C telemetery -Decrease [place] -Continue fentanyl continuous infusion and prn bolus for pain, agitation, SOB -Lorazepam 1mg  IV q4hr prn anxiety -haldol .5 mg IV q4hr prn agitation -Do not check sats. Do not titrate O2. Give benzos and/or opioids for shortness of breath, dyspnea, increased work of breathing or respiratory rate over 22.   Call PMT at  [phone] for uncontrolled symptoms from 0700-1900. Outside of these hours, please contact primary team.      Code Status/Advance Care Planning:  DNR    Symptom Management:   As above  Palliative Prophylaxis:   Frequent Pain Assessment  Additional Recommendations (Limitations, Scope, Preferences):  Full Comfort Care  Psycho-social/Spiritual:   Desire for further Chaplaincy support:No  Additional Recommendations: Caregiving  Support/Resources  Prognosis:    Hours - Days - patient is actively dying  Discharge Planning: Anticipated Hospital Death  Primary Diagnoses: Present on Admission: . Acute on chronic respiratory failure with hypoxia ([place]) . Nonspecific interstitial pneumonitis, fibrosing type with organizing pneumonia . Community acquired pneumonia . Acute respiratory failure with hypoxia ([place]) . Acute hypoxemic respiratory failure ([place])   I have reviewed the medical record, interviewed the patient and family, and examined the patient. The following aspects are pertinent.  Past Medical History:  Diagnosis Date  . Adenomatous colon polyp   . Anxiety and depression   . Asthma   . Cirrhosis ([place])   . Colon polyps   . GERD (gastroesophageal reflux disease)   . Hyperlipidemia   . IBS (irritable bowel syndrome)   . Iron deficiency anemia   . Migraine headache   .  MVP (mitral valve prolapse)   . NSIP (nonspecific interstitial pneumonia) ([place])   . Pulmonary fibrosis, unspecified ([place])   . Spontaneous pneumothorax    Social History   Social History  . Marital status: Divorced    Spouse name: N/A  . Number of children: 2  . Years of education: N/A   Occupational History  . reception/ admin assistant [employer]   Social History Main Topics  . Smoking status: Former Smoker    Packs/day: 0.50    Years: 20.00    Types: Cigarettes    Quit date: 11/06/1998  . Smokeless tobacco: Never Used  . Alcohol use 0.0 oz/week     Comment: socially  . Drug  use: No  . Sexual activity: No   Other Topics Concern  . None   Social History Narrative  . None   Family History  Problem Relation Age of Onset  . Hypertension Father   . Lung cancer Mother   . Colon polyps Mother   . Stroke Mother   . Diabetes Maternal Grandfather   . Cancer Brother     oral squamous cell  . Colitis Maternal Aunt   . Diverticulitis Brother     also mother   Scheduled Meds: . methylPREDNISolone (SOLU-MEDROL) injection  40 mg Intravenous Q6H   Continuous Infusions: . fentaNYL infusion INTRAVENOUS 100 mcg/hr (11/08/16 0545)   PRN Meds:.acetaminophen (TYLENOL) oral liquid 160 mg/5 mL, acetaminophen **OR** acetaminophen, albuterol, fentaNYL, glycopyrrolate **OR** glycopyrrolate **OR** glycopyrrolate, guaiFENesin, haloperidol **OR** haloperidol **OR** haloperidol lactate, LORazepam, ondansetron **OR** ondansetron (ZOFRAN) IV, phenol, polyvinyl alcohol, promethazine **OR** promethazine **OR** promethazine Medications Prior to Admission:  Prior to Admission medications   Medication Sig Start Date End Date Taking? Authorizing Provider  acetaminophen (TYLENOL) 500 MG tablet Take 1,000 mg by mouth every 6 (six) hours as needed for fever.   Yes Historical Provider, MD  albuterol (PROVENTIL HFA;VENTOLIN HFA) 108 (90 Base) MCG/ACT inhaler Inhale 2 puffs into the lungs every 6 (six) hours as needed for wheezing or shortness of breath.   Yes Historical Provider, MD  Aspirin-Salicylamide-Caffeine (BC FAST PAIN RELIEF) 650-195-33.3 MG PACK Take 1 Package by mouth daily as needed.   Yes Historical Provider, MD  azelastine (ASTELIN) 0.1 % nasal spray Place 2 sprays into both nostrils daily. Use in each nostril as directed    Yes Historical Provider, MD  benzonatate (TESSALON) 100 MG capsule Take 100 mg by mouth 3 (three) times daily as needed for cough.   Yes Historical Provider, MD  calcium-vitamin D (OSCAL WITH D) 500-200 MG-UNIT tablet Take 1 tablet by mouth 2 (two) times  daily.    Yes Historical Provider, MD  Cholecalciferol 2000 units CAPS Take 2,000 Units by mouth 2 (two) times daily.   Yes Historical Provider, MD  codeine 30 MG tablet Take 10 mg by mouth every 6 (six) hours as needed (cough).    Yes Historical Provider, MD  DM-Doxylamine-Acetaminophen (NYQUIL COLD & FLU PO) Take 30 mLs by mouth every 6 (six) hours as needed (COUGH).   Yes Historical Provider, MD  estradiol (ESTRACE) 1 MG tablet Take 1 mg by mouth daily.   Yes Historical Provider, MD  FLUoxetine (PROZAC) 20 MG capsule Take 20 mg by mouth at bedtime.    Yes Historical Provider, MD  fluticasone (FLONASE) 50 MCG/ACT nasal spray Place 2 sprays into the nose daily. 07/09/13  Yes Collene Gobble, MD  gabapentin (NEURONTIN) 300 MG capsule Take 300 mg by mouth 3 (three) times daily.  Yes Historical Provider, MD  guaiFENesin (MUCINEX) 600 MG 12 hr tablet Take 1,200 mg by mouth 2 (two) times daily as needed for to loosen phlegm.    Yes Historical Provider, MD  ibuprofen (ADVIL,MOTRIN) 200 MG tablet Take 400 mg by mouth every 6 (six) hours as needed for moderate pain.    Yes Historical Provider, MD  loratadine (CLARITIN) 10 MG tablet Take 10 mg by mouth at bedtime.   Yes Historical Provider, MD  medroxyPROGESTERone (PROVERA) 2.5 MG tablet Take 2.5 mg by mouth at bedtime.    Yes Historical Provider, MD  mycophenolate (CELLCEPT) 500 MG tablet Take 1,000 mg by mouth 2 (two) times daily.   Yes Historical Provider, MD  pantoprazole (PROTONIX) 40 MG tablet Take 40 mg by mouth daily.   Yes Historical Provider, MD  predniSONE (DELTASONE) 20 MG tablet Take 20 mg by mouth daily with breakfast.   Yes Historical Provider, MD  pseudoephedrine (SUDAFED) 30 MG tablet Take 60 mg by mouth every 6 (six) hours as needed for congestion.    Yes Historical Provider, MD  sulfamethoxazole-trimethoprim (BACTRIM DS,SEPTRA DS) 800-160 MG tablet Take 1 tablet by mouth 3 (three) times a week.   Yes Historical Provider, MD  Olodaterol HCl  (STRIVERDI RESPIMAT) 2.5 MCG/ACT AERS Inhale 2 puffs into the lungs daily.    Historical Provider, MD   Allergies  Allergen Reactions  . Darvocet [Propoxyphene N-Acetaminophen] Hives  . Hydrocodone Hives  . Oxycodone Hives  . Vicodin [Hydrocodone-Acetaminophen] Hives   Review of Systems  Unable to perform ROS: Acuity of condition    Physical Exam  Constitutional: No distress.  Cardiovascular:  Tachycardic, distal pulses weak   Pulmonary/Chest:  Diminished sounds, agonal respirations  Abdominal: Soft.  Musculoskeletal: She exhibits edema.  Neurological:  unresponsive  Skin:  Mottled limbs, hands purple  Psychiatric:  Unresponsive   Nursing note and vitals reviewed.   Vital Signs: BP 120/72   Pulse (!) 113   Temp 97.8 F (36.6 C) (Axillary)   Resp 11   Wt 70.3 kg (154 lb 15.7 oz)   SpO2 (!) 85%   BMI 29.28 kg/m  Pain Assessment: No/denies pain POSS *See Group Information*: S-Acceptable,Sleep, easy to arouse Pain Score: Asleep   SpO2: SpO2: (!) 85 % O2 Device:SpO2: (!) 85 % O2 Flow Rate: .O2 Flow Rate (L/min): 40 L/min  IO: Intake/output summary:  Intake/Output Summary (Last 24 hours) at Oct 20, 2016 1055 Last data filed at 2016-10-20 0700  Gross per 24 hour  Intake            664.4 ml  Output              760 ml  Net            -95.6 ml    LBM: Last BM Date: 10/25/2016 Baseline Weight: Weight: 69.6 kg (153 lb 8 oz) Most recent weight: Weight: 70.3 kg (154 lb 15.7 oz)     Palliative Assessment/Data: PPS: 10%     Thank you for this consult. Palliative medicine will continue to follow and assist as needed.   Time In: 1015  Time Out: 1105 Time Total: 50 mins Greater than 50%  of this time was spent counseling and coordinating care related to the above assessment and plan.  Signed by: Mariana Kaufman, AGNP-C Palliative Medicine    Please contact Palliative Medicine Team phone at [phone] for questions and concerns.  For individual provider: See  Shea Evans

## 2016-11-06 DEATH — deceased

## 2016-11-07 ENCOUNTER — Encounter (HOSPITAL_COMMUNITY): Payer: No Typology Code available for payment source

## 2016-11-09 ENCOUNTER — Encounter (HOSPITAL_COMMUNITY): Payer: No Typology Code available for payment source

## 2016-12-07 NOTE — Discharge Summary (Signed)
[name], [name]                  ACCOUNT NO.:  [id number]  MEDICAL RECORD NO.:  [id number]  LOCATION:  A08C                         FACILITY:  [facility]  PHYSICIAN:  Providence Lanius, MD  DATE OF BIRTH:  [birth year]  DATE OF ADMISSION:  10/31/2016 DATE OF DISCHARGE:                              DISCHARGE SUMMARY   DEATH SUMMARY  PRIMARY DIAGNOSIS/CAUSE OF DEATH:  Hypoxemic respiratory failure.  SECONDARY DIAGNOSES:  __________ disease, gastroesophageal reflux disease, cirrhosis, rheumatic disease, interstitial lung disease and chronic respiratory failure.  The patient is a 51 year old female with history of NSIP, that is O2 dependent with some evidence of pulmonary hypertension, who has been followed from the [place] in [place] since early 2017, who was on CellCept and prednisone, and presented to the hospital with acute-on-chronic hypoxemic respiratory failure with concern for an HCAP and an ILD flare. The patient was started on broad-spectrum antibiotics as well as PCP treatment; however, continued to deteriorate.  The patient did not want to be intubated and with worsening respiratory failure.  Decision was made to start a fentanyl drip for comfort care with palliative care consultation.  So, steroids and CellCept were discontinued as well as antibiotics, blood draws and CBGs.  The patient was to proceed with comfort care to expire shortly thereafter comfortably with the family at beside.     Providence Lanius, MD     WJY/MEDQ  D:  11/20/2016  T:  11/20/2016  Job:  [id number]

## 2018-04-09 IMAGING — CR DG CHEST 1V PORT
1 series · 1 of 1 positions shown · non-contrast
Comparison: 09/22/2014

CLINICAL DATA: Dyspnea.

EXAM:
PORTABLE CHEST 1 VIEW

[AP]
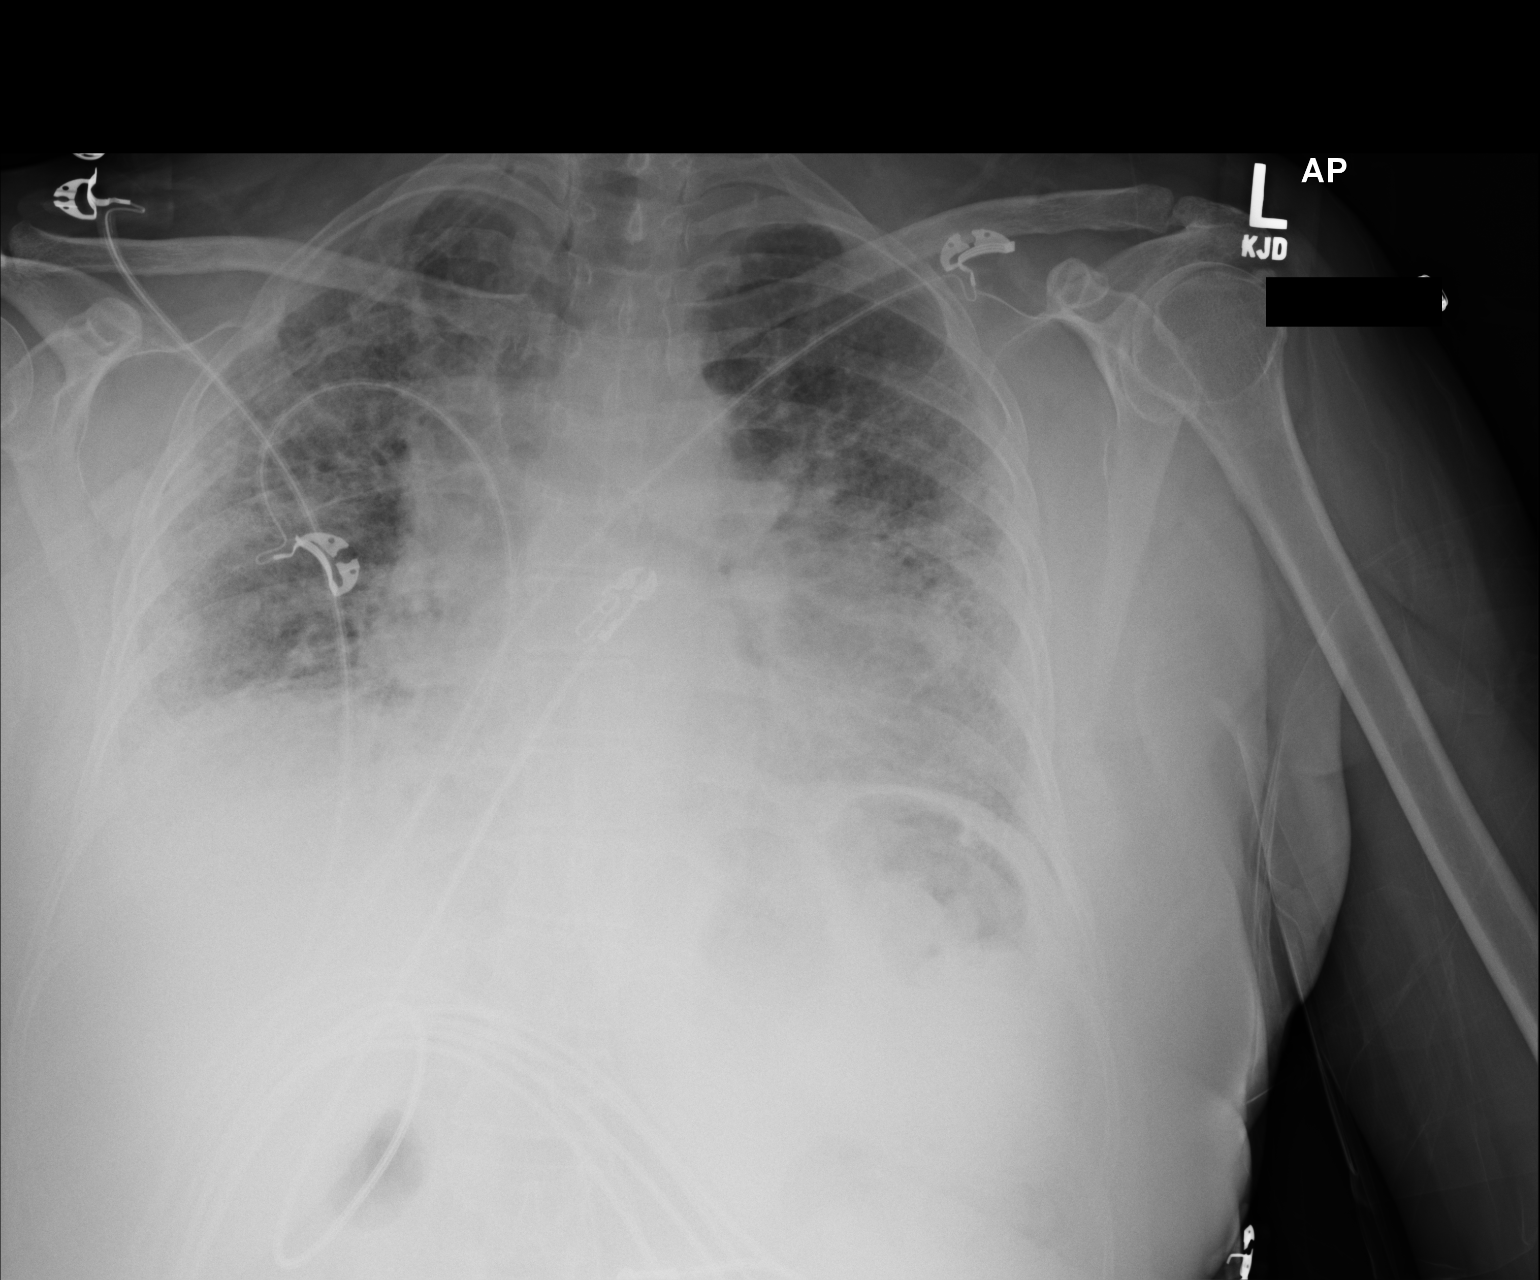

[1 of 1 positions shown; findings below may reference images not displayed]

FINDINGS: Moderate vascular fullness and indistinctness. Diffuse interstitial
thickening. Unchanged moderate cardiomegaly. No focal airspace
consolidation. No large effusion.
IMPRESSION: Vascular and interstitial changes suggest a component of congestive
heart failure. No focal consolidation or large effusion.
